# Patient Record
Sex: Male | Born: 1955
Health system: Southern US, Community
[De-identification: ages and names within clinical notes are randomized; demographics above are authoritative.]

## PROBLEM LIST (undated history)

## (undated) DIAGNOSIS — R972 Elevated prostate specific antigen [PSA]: Secondary | ICD-10-CM

## (undated) DIAGNOSIS — E119 Type 2 diabetes mellitus without complications: Secondary | ICD-10-CM

## (undated) DIAGNOSIS — I498 Other specified cardiac arrhythmias: Secondary | ICD-10-CM

## (undated) DIAGNOSIS — K219 Gastro-esophageal reflux disease without esophagitis: Secondary | ICD-10-CM

## (undated) DIAGNOSIS — F419 Anxiety disorder, unspecified: Secondary | ICD-10-CM

## (undated) DIAGNOSIS — F32A Depression, unspecified: Secondary | ICD-10-CM

## (undated) DIAGNOSIS — I4891 Unspecified atrial fibrillation: Secondary | ICD-10-CM

## (undated) DIAGNOSIS — F319 Bipolar disorder, unspecified: Secondary | ICD-10-CM

## (undated) DIAGNOSIS — I10 Essential (primary) hypertension: Secondary | ICD-10-CM

## (undated) DIAGNOSIS — E785 Hyperlipidemia, unspecified: Secondary | ICD-10-CM

## (undated) DIAGNOSIS — I219 Acute myocardial infarction, unspecified: Secondary | ICD-10-CM

## (undated) DIAGNOSIS — F329 Major depressive disorder, single episode, unspecified: Secondary | ICD-10-CM

## (undated) DIAGNOSIS — N400 Enlarged prostate without lower urinary tract symptoms: Secondary | ICD-10-CM

## (undated) DIAGNOSIS — F988 Other specified behavioral and emotional disorders with onset usually occurring in childhood and adolescence: Secondary | ICD-10-CM

## (undated) HISTORY — PX: COLONOSCOPY: SHX174

## (undated) HISTORY — DX: Hyperlipidemia, unspecified: E78.5

## (undated) HISTORY — DX: Other specified behavioral and emotional disorders with onset usually occurring in childhood and adolescence: F98.8

## (undated) HISTORY — PX: PROSTATE BIOPSY: SHX241

## (undated) HISTORY — DX: Benign prostatic hyperplasia without lower urinary tract symptoms: N40.0

## (undated) HISTORY — DX: Anxiety disorder, unspecified: F41.9

## (undated) HISTORY — DX: Type 2 diabetes mellitus without complications: E11.9

## (undated) HISTORY — PX: OTHER SURGICAL HISTORY: SHX169

## (undated) HISTORY — DX: Other specified cardiac arrhythmias: I49.8

## (undated) HISTORY — PX: FRACTURE SURGERY: SHX138

## (undated) HISTORY — DX: Bipolar disorder, unspecified: F31.9

## (undated) HISTORY — DX: Elevated prostate specific antigen (PSA): R97.20

## (undated) HISTORY — DX: Major depressive disorder, single episode, unspecified: F32.9

## (undated) HISTORY — DX: Depression, unspecified: F32.A

## (undated) HISTORY — DX: Acute myocardial infarction, unspecified: I21.9

## (undated) HISTORY — PX: LEG SURGERY: SHX1003

## (undated) HISTORY — DX: Gastro-esophageal reflux disease without esophagitis: K21.9

---
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
## 1997-10-31 HISTORY — PX: CARPAL TUNNEL RELEASE: SHX101

## 1999-01-20 ENCOUNTER — Encounter: Payer: Self-pay | Admitting: Vascular Surgery

## 1999-01-26 ENCOUNTER — Encounter: Payer: Self-pay | Admitting: Orthopedic Surgery

## 1999-01-26 ENCOUNTER — Inpatient Hospital Stay (HOSPITAL_COMMUNITY): Admission: RE | Admit: 1999-01-26 | Discharge: 1999-01-28 | Payer: Self-pay | Admitting: Orthopedic Surgery

## 2000-04-11 ENCOUNTER — Encounter: Payer: Self-pay | Admitting: Family Medicine

## 2000-04-11 ENCOUNTER — Encounter: Admission: RE | Admit: 2000-04-11 | Discharge: 2000-04-11 | Payer: Self-pay | Admitting: Family Medicine

## 2002-01-17 ENCOUNTER — Ambulatory Visit (HOSPITAL_BASED_OUTPATIENT_CLINIC_OR_DEPARTMENT_OTHER): Admission: RE | Admit: 2002-01-17 | Discharge: 2002-01-18 | Payer: Self-pay | Admitting: Orthopedic Surgery

## 2005-02-17 ENCOUNTER — Ambulatory Visit (HOSPITAL_COMMUNITY): Admission: RE | Admit: 2005-02-17 | Discharge: 2005-02-17 | Payer: Self-pay | Admitting: *Deleted

## 2005-10-31 HISTORY — PX: CERVICAL FUSION: SHX112

## 2007-03-30 ENCOUNTER — Observation Stay (HOSPITAL_COMMUNITY): Admission: RE | Admit: 2007-03-30 | Discharge: 2007-03-30 | Payer: Self-pay | Admitting: Neurosurgery

## 2007-06-09 ENCOUNTER — Emergency Department (HOSPITAL_COMMUNITY): Admission: EM | Admit: 2007-06-09 | Discharge: 2007-06-09 | Payer: Self-pay | Admitting: Emergency Medicine

## 2010-10-31 HISTORY — PX: HERNIA REPAIR: SHX51

## 2010-12-18 ENCOUNTER — Other Ambulatory Visit (HOSPITAL_COMMUNITY): Payer: Managed Care, Other (non HMO)

## 2010-12-18 ENCOUNTER — Inpatient Hospital Stay (HOSPITAL_COMMUNITY)
Admission: EM | Admit: 2010-12-18 | Discharge: 2010-12-19 | DRG: 312 | Disposition: A | Discharge status: Home / Self Care | Payer: Managed Care, Other (non HMO) | Attending: Cardiology | Admitting: Cardiology

## 2010-12-18 ENCOUNTER — Encounter (HOSPITAL_COMMUNITY): Payer: Self-pay | Admitting: Radiology

## 2010-12-18 ENCOUNTER — Emergency Department (HOSPITAL_COMMUNITY): Payer: Managed Care, Other (non HMO)

## 2010-12-18 DIAGNOSIS — E7800 Pure hypercholesterolemia, unspecified: Secondary | ICD-10-CM | POA: Diagnosis present

## 2010-12-18 DIAGNOSIS — Z7982 Long term (current) use of aspirin: Secondary | ICD-10-CM

## 2010-12-18 DIAGNOSIS — R55 Syncope and collapse: Principal | ICD-10-CM | POA: Diagnosis present

## 2010-12-18 DIAGNOSIS — Z833 Family history of diabetes mellitus: Secondary | ICD-10-CM

## 2010-12-18 DIAGNOSIS — E739 Lactose intolerance, unspecified: Secondary | ICD-10-CM | POA: Diagnosis present

## 2010-12-18 DIAGNOSIS — Z8249 Family history of ischemic heart disease and other diseases of the circulatory system: Secondary | ICD-10-CM

## 2010-12-18 DIAGNOSIS — I4891 Unspecified atrial fibrillation: Secondary | ICD-10-CM | POA: Diagnosis present

## 2010-12-18 DIAGNOSIS — I119 Hypertensive heart disease without heart failure: Secondary | ICD-10-CM | POA: Diagnosis present

## 2010-12-18 HISTORY — DX: Unspecified atrial fibrillation: I48.91

## 2010-12-18 HISTORY — DX: Essential (primary) hypertension: I10

## 2010-12-18 LAB — POCT I-STAT, CHEM 8
BUN: 15 mg/dL (ref 6–23)
Calcium, Ion: 1.17 mmol/L (ref 1.12–1.32)
Chloride: 101 mEq/L (ref 96–112)
Creatinine, Ser: 1.1 mg/dL (ref 0.4–1.5)
Glucose, Bld: 191 mg/dL — ABNORMAL HIGH (ref 70–99)
HCT: 43 % (ref 39.0–52.0)
Hemoglobin: 14.6 g/dL (ref 13.0–17.0)
Potassium: 3.9 mEq/L (ref 3.5–5.1)
Sodium: 138 mEq/L (ref 135–145)
TCO2: 27 mmol/L (ref 0–100)

## 2010-12-18 LAB — CARDIAC PANEL(CRET KIN+CKTOT+MB+TROPI)
CK, MB: 1.2 ng/mL (ref 0.3–4.0)
Total CK: 59 U/L (ref 7–232)
Troponin I: 0.01 ng/mL (ref 0.00–0.06)

## 2010-12-18 LAB — POCT CARDIAC MARKERS: Myoglobin, poc: 41.7 ng/mL (ref 12–200)

## 2010-12-18 LAB — DIFFERENTIAL
Basophils Absolute: 0 10*3/uL (ref 0.0–0.1)
Basophils Relative: 1 % (ref 0–1)
Monocytes Absolute: 0.5 10*3/uL (ref 0.1–1.0)
Neutro Abs: 4 10*3/uL (ref 1.7–7.7)
Neutrophils Relative %: 62 % (ref 43–77)

## 2010-12-18 LAB — CBC
Hemoglobin: 14.7 g/dL (ref 13.0–17.0)
MCHC: 36.5 g/dL — ABNORMAL HIGH (ref 30.0–36.0)
WBC: 6.5 10*3/uL (ref 4.0–10.5)

## 2010-12-18 LAB — GLUCOSE, CAPILLARY: Glucose-Capillary: 132 mg/dL — ABNORMAL HIGH (ref 70–99)

## 2010-12-18 LAB — HEMOGLOBIN A1C: Mean Plasma Glucose: 140 mg/dL — ABNORMAL HIGH (ref <117)

## 2010-12-19 LAB — BASIC METABOLIC PANEL
BUN: 9 mg/dL (ref 6–23)
CO2: 29 mEq/L (ref 19–32)
Chloride: 106 mEq/L (ref 96–112)
Creatinine, Ser: 1.11 mg/dL (ref 0.4–1.5)

## 2010-12-19 LAB — CBC
MCH: 29.7 pg (ref 26.0–34.0)
MCHC: 34.2 g/dL (ref 30.0–36.0)
MCV: 86.8 fL (ref 78.0–100.0)
Platelets: 190 10*3/uL (ref 150–400)
RDW: 13 % (ref 11.5–15.5)

## 2010-12-24 NOTE — H&P (Signed)
NAMEGENNARO, Sean Price              ACCOUNT NO.:  [id number]  MEDICAL RECORD NO.:  [id number]           PATIENT TYPE:  E  LOCATION:  [facility]                         FACILITY:  [facility]  PHYSICIAN:  Karn Cassis, MD  DATE OF BIRTH:  1955/11/12  DATE OF ADMISSION:  12/18/2010 DATE OF DISCHARGE:                             HISTORY & PHYSICAL   PRIMARY CARDIOLOGIST:  Armanda Magic, MD.  PRIMARY CARE PHYSICIAN:  Dr. Tanya Nones.  CHIEF COMPLAINT:  I passed out.  HISTORY OF PRESENT ILLNESS:  Sean Price is a 55 year old male with history of glucose intolerance and hypertension who is being admitted for syncope and new diagnosis of atrial fibrillation.  The patient was in his usual state of health up until 10 p.m. on December 17, 2010 when he lost consciousness shortly after getting up from a toilet after a bowel movement.  The patient reports that he has had loose stools for the past 1 day.  He felt nauseous and lightheadedright before passing out.  He recalls that as soon as he got up from the toilet, he passed out but denies hitting his head on the floor.  His wife who was immediately available estimates that the loss of consciousness was approximately 2-3 minutes in duration.  His wife did not notice any seizure activity.  The patient denies bowel or bladder incontinence.  When the EMS arrived, his blood glucose was approximately 150.  They performed orthostatic maneuvers.  His blood pressure supine was 108/60 with a heart rate of 107.  When he stood up his blood pressure dropped to 72/56 with a heart rate of 123.  At this point, he was brought to the emergency department.  After arrival at the [facility] Emergency Department, he was found to be in atrial fibrillation with heart rate of 105.  He was given a single dose of metoprolol IV.  REVIEW OF SYSTEMS:  A 12-point review of systems is negative except for symptoms outlined in the HPI above.  Specifically, the patient denies any  symptoms of chest tightness, palpitations, dyspnea, orthopnea.  He has 20-pound weight loss which he reports that intentional.  He has been trying to loss weight for the last 6 months.  He has had occasional loose stools.  The remainder of review of system was negative.  PAST MEDICAL HISTORY: 1. History of hypertension. 2. "Borderline diabetic."  PAST SURGICAL HISTORY:  He has had history of cervical decompression.  RELEVANT CARDIAC HISTORY:  The patient was initially evaluated for symptoms of indigestion in 2006.  He had a nuclear stress test, which was positive for questionable anterior ischemia.  This led to a cardiac catheterization, which was done on February 17, 2005.  The cath showed absolutely no angiographic evidence of coronary disease.  His EF was 65- 70% at that point.  MEDICATIONS:  He is on amlodipine 5 mg once a day which was recently added as well as lisinopril 40 mg once a day.  FAMILY HISTORY:  His mother had history of diabetes.  Father had history of coronary disease and died of an MI.  His siblings have history of diabetes.  SOCIAL  HISTORY:  He works at a [employer].  He denies any history of tobacco use, alcohol abuse or illicit drug use.  PHYSICAL EXAMINATION:  VITALS:  Temperature 98.0, pulse of 111, respiratory rate is 16, saturating 98% on room air, his blood pressure is 113/79. GENERAL:  The patient is a well-appearing male in no apparent distress. HEENT:  Sclera is clear.  Extraocular movement is intact. NECK:  No carotid bruit.  No JVD. CARDIOVASCULAR:  Irregular.  Normal S1,S2.  Pulses are 2+ bilaterally. No murmurs. LUNGS:  Clear to auscultation bilaterally. SKIN:  No rashes or lesions. ABDOMEN:  Soft, nontender, nondistended. EXTREMITIES:  No signs of clubbing, cyanosis or edema. NEUROLOGIC:  Alert and oriented x3.  Cranial nerves II-XII are grossly intact.  EKG shows atrial fibrillation with old inferior Q-waves, which were present  when compared to an EKG from March 2010.  LABORATORY DATA:  His chemistries are unremarkable with except for glucose of 191.  His first set of cardiac markers are negative.  ASSESSMENT/PLAN:  In summary, this is a 55 year old male with history of hypertension, "borderline diabetes" who is being admitted for syncope and new diagnosis of atrial fibrillation. 1. Syncope:  The most likely etiology is vasovagal given the fact that     the patient was having bowel movement and felt nauseous right     before passing out.  This was probably compounded by hypovolemia as     determined by positive orthostatic maneuvers.  His systolic dropped     from 108 down to 72.  Our plan at this point is to hydrate him     gently and recheck orthostatic blood pressures.  In the meanwhile,     I will perform a cardiac echo, order carotid Dopplers, place him on     telemetry, and order MRI of the head to look for other     cardiovascular causes of syncope. 2. New atrial fibrillation:  The patient's CHADS score is 1.  However,     if his questionable history of diabetes is counted that his CHADS     score becomes 2.  For now, I will start him on a full dose aspirin,     initiate beta blocker for better rate control, check TSH, and order     transthoracic echo to look for valvular disease.  We will also     further evaluate his questionable history of diabetes by obtaining     hemoglobin A1c and fasting blood glucose.  If the patient is found     to be truly diabetic, he may benefit for anticoagulation with     either  Pradaxa or     warfarin. 3. History of hypertension:  The patient's blood pressures have been     on the low side with a systolic between 72 to low one teens.  After     adequate IV resuscitation, we will restart him on his home dose of     lisinopril.     Karn Cassis, MD     PV/MEDQ  D:  12/18/2010  T:  12/18/2010  Job:  [id number]  Electronically Signed by Karn Cassis MD on  12/24/2010 08:59:53 PM

## 2011-01-17 NOTE — Discharge Summary (Signed)
[name], [name]              ACCOUNT NO.:  [id number]  MEDICAL RECORD NO.:  [id number]           PATIENT TYPE:  I  LOCATION:  3712                         FACILITY:  [facility]  PHYSICIAN:  Sean Price, M.D.DATE OF BIRTH:  03/01/1956  DATE OF ADMISSION:  12/18/2010 DATE OF DISCHARGE:  12/19/2010                              DISCHARGE SUMMARY   FINAL DIAGNOSES: 1. Syncope. 2. New onset of atrial fibrillation, resolved. 3. Hypertensive heart disease. 4. Glucose intolerance. 5. History of cervical decompression.  PROCEDURE:  Echocardiogram.  HISTORY OF PRESENT ILLNESS:  A 55 year old male, who has a previous history of hypertensive heart disease and glucose intolerance.  He has a previous history of being evaluated for chest pain and had a negative catheterization in April 2006 done by Dr. Fraser Price.  He also has hypertensive heart disease and recently had amlodipine added to his regimen, but reportedly normotensive at home.  He was in his usual state of health until the night of admission that he developed the onset of syncope after having had a bowel movement with some diarrhea.  He was having loose stools for the past day or so.  He felt nausea, some lightheaded, and then was unconscious.  His wife called EMS.  He was found to be in atrial fibrillation when he came in and was found to be somewhat orthostatic.  He was given a single dose of metoprolol IV and was admitted to the hospital for further evaluation.  Please see the previously dictated history and physical for remainder of the details.  HOSPITAL COURSE:  Lab data on admission showed a hemoglobin of 14.6, hematocrit of 43.  His hemoglobin A1c is 6.5.  His troponin is negative. Sodium is 141, potassium 4.5, chloride 106, CO2 of 29, glucose 131, BUN is 9, and creatinine is 1.11.  He had an MRI of his brain done this admission that was negative for acute infarct.  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  There was no chest x- ray done on admission.  His blood pressure was normotensive when he was in the hospital.  His amlodipine was withheld and his ACE inhibitor was withheld.  He was given flecainide and converted to sinus rhythm and the flecainide was stopped.  His anticoagulation was stopped.  He was feeling well and was discharged home on metoprolol 25 mg b.i.d., aspirin 81 mg daily, and lisinopril 40 mg daily.  He will stop his amlodipine at home.  He has a hemoglobin A1c of 6.5, so there is a question about whether he has diabetes or not.  For the time being, we will anticoagulate him with aspirin, but with hypertension and possible diabetes, he could have a CHADS score of 2 and may warrant anticoagulation with Pradaxa or warfarin, but we will leave this decision to Dr. Mayford Price.  He is discharged at this time in improved condition and is to call Dr. Mayford Price for an appointment this week.  He will continue to follow up with his primary doctor.  He will need to have the cyst noted evaluated.     Sean Price  Sean Price, M.D.     WST/MEDQ  D:  12/19/2010  T:  12/19/2010  Job:  [id number]  cc:   Sean Price, M.D.  Electronically Signed by Lacretia Nicks. Sean Price M.D. on 01/17/2011 04:56:39 PM

## 2011-03-15 NOTE — Op Note (Signed)
Sean Price, Sean Price              ACCOUNT NO.:  [id number]   MEDICAL RECORD NO.:  [id number]          PATIENT TYPE:  AMB   LOCATION:  SDS                          FACILITY:  [facility]   PHYSICIAN:  Coletta Memos, M.D.     DATE OF BIRTH:  1956-10-19   DATE OF PROCEDURE:  03/29/2007  DATE OF DISCHARGE:                               OPERATIVE REPORT   PREOPERATIVE DIAGNOSIS:  1. Cervical spondylosis with myelopathy C5-6, C6-7.  2. Cervical radiculopathy.  3. Cervical displaced disk, right C6-7.   INDICATIONS:  Navon Kotowski presented to the office with severe pain in  his right upper extremity.  MRI showed what was a fairly large disk  herniation at C6-7 and foraminal narrowing present at C5-6.  I therefore  recommended and he agreed to undergo operative decompression at C5-6 and  C6-7 via an anterior approach.   PROCEDURE:  1. Anterior cervical decompression C5-6, C6-7.  2. Arthrodesis C5 to C7 with two 7-mm allografts.  3. Anterior instrumentation C5-C7 using a Vector 32 mm plate.   COMPLICATIONS:  None.   SURGEON:  Coletta Memos, M.D.   ASSISTANT:  Clydene Fake, M.D.   OPERATIVE NOTE:  Mr. Roye was brought to the operating room  intubated and placed under general anesthetic without difficulty.  He  was positioned with his head in neutral position on a horseshoe  headrest.  His neck was prepped and he was draped in a sterile fashion.  I infiltrated 3 mL 0.5% lidocaine 1:200,000 strength epinephrine.  I  made an incision with a #10 blade.  I took this down to the platysma.  I  then dissected rostrally and caudally in a plane superior to the  platysma.  I opened the platysma in a horizontal fashion using  Metzenbaum scissors.  I then dissected rostrally and caudally in a plane  inferior to the platysma.  Using sharp dissection and blunt dissection I  was able to fashion an avascular corridor between the medial strap  muscles and the omohyoid and sternocleidomastoid and  carotid artery  laterally.  I then used a peanut to remove soft tissue from the cervical  spine.  I placed a spinal needle and that on x-ray was shown to be at C5-  6.  Using that as my guide I then reflected the longus colli muscles  bilaterally from C5-C7.  I placed self-retaining retractors and  proceeded with the decompression.   I performed decompression C5-6 first by opening the disk space with a  #15 blade.  I then used curettes, pituitary rongeurs, a high-speed drill  and Kerrison punches to remove disk end plate and osteophytes.  The  osteophytes at C5-6 level were impressive and large.  I used a drill  extensively to remove them.  I was able with Dr. Doreen Beam assistance to  effect a full decompression of the C6 nerve root on the right side and  on the left side.  We fully decompress the spinal canal at that level.  After achieving hemostasis I then prepared for the arthrodesis.   The arthrodesis was performed at C5-6 first  by using a high-speed drill  to remove all soft tissue from the ends of the intervertebral bodies.  I  also used a barrel-shaped bur to create a square space for the plug.  I  sized the allograft and felt that it would be the most appropriate to  use a 7 mm graft.  I placed a 7 mm graft without difficulty into the  disk space.  I now prepared for the C6-7 level.   Decompression at C6-7 level was performed by first using a 15 blade to  open the disk space at C6-7.  Dural large osteophytes overlying the disk  space.  I also used a pituitary rongeur and Kerrison punch to even out  the vertebral bodies there secondary to the overhang of the osteophyte.  I used a high-speed drill and removed a great deal of bone and  osteophytes.  The posterior longitudinal ligament at the C6-7 level was  very thick very redundant and obviously compressing the spinal canal.  With Dr. Doreen Beam assistance we removed that with Kerrison punches in a  slow meticulous fashion using  microdissection.  After decompressing the  thecal sac, we then turned our attention to fully decompressing the  right C7 root.  There was a large number of disk fragments overlying the  nerve root.  Using the Kerrison punch and a small hook, we were able to  remove those fragments and completely free the nerve root.  Its egress  was widely patent when I was done.  On the left side as he had no pain  there, we were not nearly as aggressive but the nerve root was well  decompressed.  I then irrigated the wound.  I then prepared for  arthrodesis.   The endplates were drilled to remove soft tissue and to even their  surfaces to accept a graft.  Again I sized and felt it best to use a 7  mm graft.  This was a Synthes ACS graft.  The graft was placed without  difficulty.  Distraction pins were used at both C5-6 and at C6-7 levels  to aid in the decompression and placement of the allograft.   I then placed the anterior instrumentation using a 32 mm plate.  The two  screws placed a C5, two at C6, two at C7 first by drilling and then  using self-tapping screws.  I also used a Leksell rongeur to even out  the anterior portion of the vertebral bodies of the osteophytes.  They  were quite uneven and it would have made it difficult to place a plate  with good purchase.   X-ray showed that the plate, plug and screws were in good position.  I  then irrigated the wound.  With Dr. Doreen Beam assistance we closed the  wound in layered fashion using Vicryl sutures to reapproximate the  platysma and subcuticular layer.  Dermabond was used for sterile  dressing.           ______________________________  Coletta Memos, M.D.     KC/MEDQ  D:  03/30/2007  T:  03/30/2007  Job:  [id number]

## 2011-03-18 NOTE — Op Note (Signed)
[place]  Patient:    ZEKI, BEDROSIAN Visit Number: [id number] MRN: [id number]          Service Type: DSU Location: [facility] Attending Physician:  Colbert Ewing Dictated by:   Loreta Ave, M.D. Proc. Date: 01/17/02 Admit Date:  01/17/2002 Discharge Date: 01/18/2002                             Operative Report  PREOPERATIVE DIAGNOSES: 1. Status post markedly comminuted open tibia and fibula fracture, right. 2. Heeled fracture with painful retained hardware. 3. Degenerative joint disease and bony impingement with loose bodies, right    ankle.  POSTOPERATIVE DIAGNOSES: 1. Status post markedly comminuted open tibia and fibula fracture, right. 2. Heeled fracture with painful retained hardware. 3. Degenerative joint disease and bony impingement with loose bodies, right    ankle.  PROCEDURES: 1. Removal of plate and screws from right tibia. 2. Removal of plate and screws from right fibula. 3. Arthroscopy, right ankle, with extensive debridement of adhesions, chondral    loose bodies, chondromalacia and removal of tibiotalar anterior spurs    causing impingement.  SURGEON:  Loreta Ave, M.D.  ASSISTANT:  Arlys John D. Petrarca, P.A.-C.  ANESTHESIA:  General.  BLOOD LOSS:  Minimal.  TOURNIQUET TIME:  One hour.  SPECIMENS:  None.  CULTURES:  None.  COMPLICATIONS:  None.  DRESSINGS:  Soft compressive with short-leg splint.  DESCRIPTION OF PROCEDURE:  The patient was brought to the operating room and after adequate anesthesia had been obtained, right ankle was examined.  No dorsiflexion and plantar flexion to about 20 degrees with crepitus with stable ankle.  Tourniquet applied as well as the stirrup for ankle arthroscopy.  The patient was prepped and draped in the usual sterile fashion.  Exsanguinated with elevation and Esmarch.  Tourniquet inflated to 350 mmHg.  Incisions over the tibia and fibula were opened exposing the  implanted hardware.  All the plate and screws were removed without difficulty.  The prominence of bone around the margins of the plate and through the screw holes were all contoured to a nice, smooth, stable surface and the wounds thoroughly irrigated.  They were closed with Vicryl and then staples with injection of Marcaine without epinephrine.  Anteromedial and anterolateral port holes for the ankle were created for arthroscopy.  Ankle entered with blunt obturator distended and inspected.  Extensive adhesions throughout all debrided.  Grade 3 with some focal grade 4 changes throughout the entire ankle.  All chondral loose bodies, uneven articular cartilages, adhesions and synovitis debrided to a stable surface.  I then did an extensive removal of the anterior impinging spurs on the tibia and talus so that on completion, I had no anterior impingement. Motion was improved to about five degrees of dorsiflexion and 40 degrees of plantar flexion maintaining a stable ankle.  The entire ankle examined arthroscopically and all loose bodies confirmed to be removed.  The most extensive changes were on the distal tibia posteriorly which were grade 3 and 4 as well as patches on the medial and lateral aspects of the talus which were grade 3 and 4.  Some remaining articular cartilage throughout, however. Instruments were removed.  Port holes and ankle injected Marcaine.  Port holes were closed with nylon.  Sterile compressive dressing and short-leg splint applied.  Tourniquet inflated and removed.  Anesthesia was reversed and brought to the recovery room.  The patient tolerated surgery  well with no complications. Dictated by:   Loreta Ave, M.D. Attending Physician:  Colbert Ewing DD:  01/17/02 TD:  01/20/02 Job: [id number]

## 2011-03-18 NOTE — Cardiovascular Report (Signed)
NAMEPHILLIP, Sean Price NO.:  [id number]   MEDICAL RECORD NO.:  [id number]          PATIENT TYPE:  OIB   LOCATION:  2899                         FACILITY:  [facility]   PHYSICIAN:  Meade Maw, M.D.    DATE OF BIRTH:  1955/12/21   DATE OF PROCEDURE:  02/17/2005  DATE OF DISCHARGE:                              CARDIAC CATHETERIZATION   INDICATIONS FOR PROCEDURE:  Ongoing chest pain. Stress Cardiolite revealing  a decrease uptake in the distal anterior wall. Minimal reversal procedure.   DESCRIPTION OF PROCEDURE:  After obtaining written informed consent, the  patient was brought to the cardiac catheterization lab in a post absorptive  state. Preoperative sedation was achieved using Versed 2 mg IV. The right  groin was prepped and draped in the usual sterile fashion. Local anesthesia  was achieved using 1% Xylocaine. A 6-French hemostasis sheath was placed  into the right femoral artery using a modified Seldinger technique.  Selective coronary angiography was performed using JL-4, JR-4 Judkins  catheter. Multiple views were obtained. All catheter exchanges were made  over a guide wire.   FINDINGS:  The aortic pressure is 135/92, LV pressure was 129/70. EDP is 12.   SINGLE PLANE VENTRICULOGRAM:  Revealed normal wall motion, ejection fraction  65-70%. There was no mitral regurgitation noted.   CORONARY ANGIOGRAPHY:  1.  Left main coronary artery bifurcates into the left anterior descending      and circumflex vessel. There is no disease noted in the left main      coronary artery.  2.  Left anterior descending:  Left anterior descending gives rise to a      small D1, moderate D2, small D3, goes on to an apical recurrent branch.      There is no disease noted in the left anterior descending branches.  3.  Circumflex vessel:  Circumflex vessel gives rise to a trivial OM1, a      large bifurcating OM2, large OM3. There was no disease noted in the      circumflex or its  branches.  4.  The right coronary artery:  Right coronary artery is dominant for the      posterior circulation. Gives rise to trivial RV marginals, PDA and PLV      branch. There is no disease noted in the right coronary artery.   FINAL IMPRESSION:  Normal coronary angiography and normal single-plane  ventriculogram.   RECOMMENDATION:  Consider other etiologies for chest pain. Consider GI  evaluation.      HP/MEDQ  D:  02/17/2005  T:  02/18/2005  Job:  [id number]

## 2011-05-17 ENCOUNTER — Encounter: Payer: Self-pay | Admitting: Gastroenterology

## 2011-05-30 ENCOUNTER — Ambulatory Visit (AMBULATORY_SURGERY_CENTER): Payer: Managed Care, Other (non HMO) | Admitting: *Deleted

## 2011-05-30 VITALS — Ht 70.0 in | Wt 225.5 lb

## 2011-05-30 DIAGNOSIS — Z1211 Encounter for screening for malignant neoplasm of colon: Secondary | ICD-10-CM

## 2011-05-30 MED ORDER — PEG-KCL-NACL-NASULF-NA ASC-C 100 G PO SOLR: ORAL | Status: DC

## 2011-06-01 ENCOUNTER — Encounter: Payer: Self-pay | Admitting: Gastroenterology

## 2011-06-13 ENCOUNTER — Encounter: Payer: Self-pay | Admitting: Gastroenterology

## 2011-06-13 ENCOUNTER — Ambulatory Visit (AMBULATORY_SURGERY_CENTER): Payer: Managed Care, Other (non HMO) | Admitting: Gastroenterology

## 2011-06-13 DIAGNOSIS — D126 Benign neoplasm of colon, unspecified: Secondary | ICD-10-CM

## 2011-06-13 DIAGNOSIS — Z8371 Family history of colonic polyps: Secondary | ICD-10-CM

## 2011-06-13 DIAGNOSIS — Z1211 Encounter for screening for malignant neoplasm of colon: Secondary | ICD-10-CM

## 2011-06-13 DIAGNOSIS — Z800 Family history of malignant neoplasm of digestive organs: Secondary | ICD-10-CM

## 2011-06-13 MED ORDER — SODIUM CHLORIDE 0.9 % IV SOLN: 500.0000 mL | INTRAVENOUS | Status: DC

## 2011-06-13 NOTE — Progress Notes (Signed)
Pt tolerated the exam very well.MAW 

## 2011-06-13 NOTE — Patient Instructions (Signed)
Please read the handouts given to you by your recovery room nurse.  Your biopsy results will be mailed to you within 2 weeks.   You may resume your routine medications today.  If you have any questions, please call us at [phone]. Thank-you. 

## 2011-06-14 ENCOUNTER — Telehealth: Payer: Self-pay

## 2011-06-14 NOTE — Telephone Encounter (Signed)

## 2011-06-21 ENCOUNTER — Encounter: Payer: Self-pay | Admitting: Gastroenterology

## 2013-03-01 ENCOUNTER — Other Ambulatory Visit: Payer: Self-pay | Admitting: Physician Assistant

## 2013-03-01 NOTE — Telephone Encounter (Signed)
Med refill made.  Patient is due for office visit.  Pt  Called and left mess to call and schedule appt.

## 2013-03-07 ENCOUNTER — Ambulatory Visit (INDEPENDENT_AMBULATORY_CARE_PROVIDER_SITE_OTHER): Payer: BC Managed Care – PPO | Admitting: Family Medicine

## 2013-03-07 ENCOUNTER — Encounter: Payer: Self-pay | Admitting: Family Medicine

## 2013-03-07 VITALS — BP 110/86 | HR 86 | Temp 98.1°F | Resp 18 | Wt 203.0 lb

## 2013-03-07 DIAGNOSIS — I152 Hypertension secondary to endocrine disorders: Secondary | ICD-10-CM | POA: Insufficient documentation

## 2013-03-07 DIAGNOSIS — E1159 Type 2 diabetes mellitus with other circulatory complications: Secondary | ICD-10-CM | POA: Insufficient documentation

## 2013-03-07 DIAGNOSIS — I10 Essential (primary) hypertension: Secondary | ICD-10-CM

## 2013-03-07 DIAGNOSIS — E1169 Type 2 diabetes mellitus with other specified complication: Secondary | ICD-10-CM | POA: Insufficient documentation

## 2013-03-07 DIAGNOSIS — E119 Type 2 diabetes mellitus without complications: Secondary | ICD-10-CM

## 2013-03-07 DIAGNOSIS — E785 Hyperlipidemia, unspecified: Secondary | ICD-10-CM

## 2013-03-07 LAB — HEPATIC FUNCTION PANEL
ALT: 14 U/L (ref 0–53)
Bilirubin, Direct: 0.1 mg/dL (ref 0.0–0.3)
Indirect Bilirubin: 0.4 mg/dL (ref 0.0–0.9)
Total Bilirubin: 0.5 mg/dL (ref 0.3–1.2)

## 2013-03-07 LAB — LIPID PANEL
Cholesterol: 174 mg/dL (ref 0–200)
LDL Cholesterol: 109 mg/dL — ABNORMAL HIGH (ref 0–99)
Total CHOL/HDL Ratio: 4 Ratio
Triglycerides: 106 mg/dL (ref <150)
VLDL: 21 mg/dL (ref 0–40)

## 2013-03-07 LAB — BASIC METABOLIC PANEL
Calcium: 9.3 mg/dL (ref 8.4–10.5)
Creat: 0.98 mg/dL (ref 0.50–1.35)

## 2013-03-07 NOTE — Progress Notes (Signed)
Subjective:    Patient ID: Sean Price, male    DOB: [birth year], 57 y.o.   MRN: [id number]  HPI  Patient's there for followup of his hypertension, hyperlipidemia, and diabetes mellitus. He is fairly taking Norvasc 2.5 mg by mouth daily, Symmetrel 20 mg by mouth daily for his hypertension. His blood pressure is 110/86. He denies any chest pain, shortness of breath, or dyspnea on exertion. He also has hyperlipidemia. He is currently taking Lipitor 40 mg by mouth daily. He denies any right upper quadrant pain or myalgias. He is due for fasting lipid panel. With regards to diabetes, he is currently taking metformin 1000 mg by mouth twice a day. He denies any polyuria, polydipsia, or blurred vision. He denies any neuropathy in his feet. He has not had an eye exam in 3 years. He denies any hypoglycemia. He rarely checks his sugars when he does they're usually less than 120. He's had 2 episodes of vasovagal syncope, which was evaluated by his cardiologist with a one-month cardiac monitor. Past Medical History  Diagnosis Date  . Atrial fibrillation with RVR     new found on 12/18/10 visit  . Diabetes mellitus without complication   . Hypertension   . Hyperlipidemia    Current Outpatient Prescriptions on File Prior to Visit  Medication Sig Dispense Refill  . atorvastatin (LIPITOR) 40 MG tablet TAKE ONE TABLET BY MOUTH EVERY DAY  30 tablet  1  . Cholecalciferol (VITAMIN D-3) 5000 UNITS TABS Take by mouth daily.        [name] lisinopril (PRINIVIL,ZESTRIL) 20 MG tablet Take 20 mg by mouth daily.         No current facility-administered medications on file prior to visit.   Allergies  Allergen Reactions  . Naproxen Sodium     hearburn   History   Social History  . Marital Status: Married    Spouse Name: N/A    Number of Children: N/A  . Years of Education: N/A   Occupational History  . Not on file.   Social History Main Topics  . Smoking status: Former Smoker    Quit date: 05/29/1984   . Smokeless tobacco: Not on file  . Alcohol Use: 0.5 oz/week    1 drink(s) per week  . Drug Use: No  . Sexually Active: Not on file   Other Topics Concern  . Not on file   Social History Narrative  . No narrative on file     Review of Systems  All other systems reviewed and are negative.       Objective:   Physical Exam  Constitutional: He is oriented to person, place, and time. He appears well-developed and well-nourished.  Eyes: Conjunctivae are normal. Pupils are equal, round, and reactive to light.  Neck: No JVD present. No thyromegaly present.  Cardiovascular: Normal rate, regular rhythm, normal heart sounds and intact distal pulses.   No murmur heard. Pulmonary/Chest: Effort normal and breath sounds normal. No respiratory distress. He has no wheezes. He has no rales.  Abdominal: Soft. Bowel sounds are normal. He exhibits no distension. There is no tenderness. There is no rebound.  Lymphadenopathy:    He has no cervical adenopathy.  Neurological: He is alert and oriented to person, place, and time. He has normal reflexes. He displays normal reflexes. He exhibits normal muscle tone. Coordination normal.  Skin: Skin is warm. No rash noted.          Assessment & Plan:  1. Type II  or unspecified type diabetes mellitus without mention of complication, not stated as uncontrolled Check hemoglobin A1c. His goal A1c is less than 6.5. I will also consult ophthalmology as he is due for her retinal examination. Given his history of paroxysmal atrial fibrillation and his diabetes, I recommended he resume an aspirin. He states his cardiologist to stop this. Be a wise idea to at least take an 81 mg baby aspirin per day. - Basic Metabolic Panel - Hepatic Function Panel - Lipid Panel - Hemoglobin A1c - Ambulatory referral to Ophthamology.  2. Hypertension Blood pressure is well controlled. Continue current medications.  4. Hyperlipidemia Fasting lipid panel, goal LDL is less  than 100.

## 2013-05-09 ENCOUNTER — Telehealth: Payer: Self-pay | Admitting: Family Medicine

## 2013-05-09 ENCOUNTER — Encounter: Payer: Self-pay | Admitting: Family Medicine

## 2013-05-09 ENCOUNTER — Ambulatory Visit (INDEPENDENT_AMBULATORY_CARE_PROVIDER_SITE_OTHER): Payer: BC Managed Care – PPO | Admitting: Family Medicine

## 2013-05-09 VITALS — BP 140/98 | HR 78 | Temp 98.3°F | Resp 16 | Wt 220.0 lb

## 2013-05-09 DIAGNOSIS — F32A Depression, unspecified: Secondary | ICD-10-CM

## 2013-05-09 DIAGNOSIS — F329 Major depressive disorder, single episode, unspecified: Secondary | ICD-10-CM

## 2013-05-09 MED ORDER — ALPRAZOLAM 0.5 MG PO TABS: 0.5000 mg | ORAL_TABLET | Freq: Every evening | ORAL | Status: DC | PRN

## 2013-05-09 MED ORDER — GLUCOSE BLOOD VI STRP: ORAL_STRIP | Status: DC

## 2013-05-09 MED ORDER — ESCITALOPRAM OXALATE 10 MG PO TABS: 10.0000 mg | ORAL_TABLET | Freq: Every day | ORAL | Status: DC

## 2013-05-09 MED ORDER — ONETOUCH ULTRA SYSTEM W/DEVICE KIT: 1.0000 | PACK | Freq: Once | Status: DC

## 2013-05-09 NOTE — Telephone Encounter (Signed)
Rx Refilled  

## 2013-05-09 NOTE — Progress Notes (Signed)
Subjective:    Patient ID: Sean Price, male    DOB: May 28, 1956, 57 y.o.   MRN: [id number]  HPI Patient brings in a copy of his most recent lab work. This reveals an HDL of 49, and LDL of 72, triglycerides of 97, and a hemoglobin A1c of 6.6.  All these results are excellent.  However his main concern today is depression.  He states over the last month he has had increasing depression and anhedonia. He cannot sleep at night. He cannot concentrate at work. He has no appetite. He is losing weight. He has no energy. He is even had thoughts of suicide. He is adamant that he would not act on these thoughts. He loves his wife and his family. He states that he is thoughts sometimes that her life would be better if he were not here. He has no plan to act on these thoughts.  I asked the patient if he thought he needed a short-term hospitalization to control the depression and he was adamant that he felt safe at home he just wanted me to know how severe the depression is becoming.   Past Medical History  Diagnosis Date  . Atrial fibrillation with RVR     new found on 12/18/10 visit  . Diabetes mellitus without complication   . Hypertension   . Hyperlipidemia    Current Outpatient Prescriptions on File Prior to Visit  Medication Sig Dispense Refill  . amLODipine (NORVASC) 2.5 MG tablet Take 2.5 mg by mouth daily.      [name] atorvastatin (LIPITOR) 40 MG tablet TAKE ONE TABLET BY MOUTH EVERY DAY  30 tablet  1  . Cholecalciferol (VITAMIN D-3) 5000 UNITS TABS Take by mouth daily.        [name] lisinopril (PRINIVIL,ZESTRIL) 20 MG tablet Take 20 mg by mouth daily.        . metFORMIN (GLUCOPHAGE) 1000 MG tablet Take 1,000 mg by mouth 2 (two) times daily with a meal.       No current facility-administered medications on file prior to visit.   Allergies  Allergen Reactions  . Naproxen Sodium     hearburn   History   Social History  . Marital Status: Married    Spouse Name: N/A    Number of Children: N/A   . Years of Education: N/A   Occupational History  . Not on file.   Social History Main Topics  . Smoking status: Former Smoker    Quit date: 05/29/1984  . Smokeless tobacco: Not on file  . Alcohol Use: 0.5 oz/week    1 drink(s) per week  . Drug Use: No  . Sexually Active: Not on file   Other Topics Concern  . Not on file   Social History Narrative  . No narrative on file   Family History  Problem Relation Age of Onset  . Colon cancer Brother     at age 17      Review of Systems  All other systems reviewed and are negative.       Objective:   Physical Exam  Vitals reviewed. Cardiovascular: Normal rate and regular rhythm.   Pulmonary/Chest: Effort normal and breath sounds normal.  Psychiatric: He has a normal mood and affect. His behavior is normal. Judgment and thought content normal.          Assessment & Plan:  1. Depression I contracted the patient for safety.  He will call me or go immediately to the hospital if he  has any thoughts of wanting to kill himself.  Meanwhile we will start Lexapro 10 mg by mouth daily. I also gave him Xanax 0.5 mg tablets to take at night as needed for sleep. I will see the patient back in 4 weeks or immediately if symptoms worsen. - escitalopram (LEXAPRO) 10 MG tablet; Take 1 tablet (10 mg total) by mouth daily.  Dispense: 30 tablet; Refill: 3 - ALPRAZolam (XANAX) 0.5 MG tablet; Take 1 tablet (0.5 mg total) by mouth at bedtime as needed for sleep.  Dispense: 30 tablet; Refill: 0

## 2013-06-04 ENCOUNTER — Other Ambulatory Visit: Payer: Self-pay | Admitting: Family Medicine

## 2013-06-04 NOTE — Telephone Encounter (Signed)
rx called in

## 2013-06-04 NOTE — Telephone Encounter (Signed)
Ok to refill 

## 2013-06-04 NOTE — Telephone Encounter (Signed)
Last OV 05/09/13  Last refill 05/09/13  #30 Need approval for controlled medication.

## 2013-06-06 ENCOUNTER — Ambulatory Visit (INDEPENDENT_AMBULATORY_CARE_PROVIDER_SITE_OTHER): Payer: BC Managed Care – PPO | Admitting: Family Medicine

## 2013-06-06 ENCOUNTER — Encounter: Payer: Self-pay | Admitting: Family Medicine

## 2013-06-06 VITALS — BP 142/90 | HR 72 | Temp 97.6°F | Resp 18 | Ht 70.0 in | Wt 205.0 lb

## 2013-06-06 DIAGNOSIS — F32A Depression, unspecified: Secondary | ICD-10-CM | POA: Insufficient documentation

## 2013-06-06 DIAGNOSIS — F329 Major depressive disorder, single episode, unspecified: Secondary | ICD-10-CM

## 2013-06-06 NOTE — Progress Notes (Signed)
Subjective:    Patient ID: Sean Price, male    DOB: 1956-04-30, 57 y.o.   MRN: [id number]  HPI  Please see the patient's last office visit.  He was started on Lexapro 10 mg by mouth daily for depression. The patient states he is doing much better. He is possibly 75% better. He is sleeping better. He is having minimal suicidal thoughts or suicidal ideation. He is calmer at work. He denies any anxiety or panic attacks. He is gaining 5 pounds due to increased appetite. He reports increasing energy. He has not needed to use Xanax in over a week. He denies any sexual side effects. He denies any gastrointestinal side effects on medication. Past Medical History  Diagnosis Date  . Atrial fibrillation with RVR     new found on 12/18/10 visit  . Diabetes mellitus without complication   . Hypertension   . Hyperlipidemia   . Depression    Current Outpatient Prescriptions on File Prior to Visit  Medication Sig Dispense Refill  . ALPRAZolam (XANAX) 0.5 MG tablet TAKE ONE TABLET BY MOUTH AT BEDTIME AS NEEDED FOR SLEEP  30 tablet  0  . amLODipine (NORVASC) 2.5 MG tablet Take 2.5 mg by mouth daily.      [name] atorvastatin (LIPITOR) 40 MG tablet TAKE ONE TABLET BY MOUTH EVERY DAY  30 tablet  1  . Blood Glucose Monitoring Suppl (ONE TOUCH ULTRA SYSTEM KIT) W/DEVICE KIT 1 kit by Does not apply route once.  1 each  11  . Cholecalciferol (VITAMIN D-3) 5000 UNITS TABS Take by mouth daily.        [name] escitalopram (LEXAPRO) 10 MG tablet Take 1 tablet (10 mg total) by mouth daily.  30 tablet  3  . fish oil-omega-3 fatty acids 1000 MG capsule Take 500 mg by mouth daily.      [name] glucose blood test strip One touch Ultra strips #100/11 refills, lancets #1 box, Checks BS bid and DX:250.00  100 each  11  . lisinopril (PRINIVIL,ZESTRIL) 20 MG tablet Take 20 mg by mouth daily.        . metFORMIN (GLUCOPHAGE) 1000 MG tablet Take 1,000 mg by mouth 2 (two) times daily with a meal.       No current facility-administered  medications on file prior to visit.   Allergies  Allergen Reactions  . Naproxen Sodium     hearburn   History   Social History  . Marital Status: Married    Spouse Name: N/A    Number of Children: N/A  . Years of Education: N/A   Occupational History  . Not on file.   Social History Main Topics  . Smoking status: Former Smoker    Quit date: 05/29/1984  . Smokeless tobacco: Not on file  . Alcohol Use: 0.5 oz/week    1 drink(s) per week  . Drug Use: No  . Sexually Active: Not on file   Other Topics Concern  . Not on file   Social History Narrative  . No narrative on file     Review of Systems  All other systems reviewed and are negative.       Objective:   Physical Exam  Vitals reviewed. Cardiovascular: Normal rate and regular rhythm.   Pulmonary/Chest: Effort normal and breath sounds normal.  Psychiatric: He has a normal mood and affect. His behavior is normal. Judgment and thought content normal.          Assessment & Plan:  1. Depression  Patient has improved dramatically. Continue Lexapro 10 mg by mouth daily. We will continue the medications for 3 months. At the end of 3 months we will try to wean him away from the Lexapro he wants to do that. I will be glad to refill the Xanax if needed I have recommended he try to use it as sparingly as possible.  Recheck in 3 months or sooner if worse.

## 2013-07-22 ENCOUNTER — Encounter: Payer: Self-pay | Admitting: Family Medicine

## 2013-07-22 DIAGNOSIS — N400 Enlarged prostate without lower urinary tract symptoms: Secondary | ICD-10-CM | POA: Insufficient documentation

## 2013-07-22 DIAGNOSIS — Z8679 Personal history of other diseases of the circulatory system: Secondary | ICD-10-CM | POA: Insufficient documentation

## 2013-07-22 DIAGNOSIS — I219 Acute myocardial infarction, unspecified: Secondary | ICD-10-CM | POA: Insufficient documentation

## 2013-07-22 DIAGNOSIS — I498 Other specified cardiac arrhythmias: Secondary | ICD-10-CM | POA: Insufficient documentation

## 2013-07-22 DIAGNOSIS — F419 Anxiety disorder, unspecified: Secondary | ICD-10-CM | POA: Insufficient documentation

## 2013-07-22 DIAGNOSIS — K219 Gastro-esophageal reflux disease without esophagitis: Secondary | ICD-10-CM | POA: Insufficient documentation

## 2013-08-06 ENCOUNTER — Other Ambulatory Visit: Payer: Self-pay | Admitting: Physician Assistant

## 2013-08-07 NOTE — Telephone Encounter (Signed)
Medication refilled per protocol. 

## 2013-09-06 ENCOUNTER — Other Ambulatory Visit: Payer: Self-pay | Admitting: Family Medicine

## 2013-09-06 ENCOUNTER — Encounter: Payer: Self-pay | Admitting: Family Medicine

## 2013-09-06 NOTE — Telephone Encounter (Signed)
Medication refill for one time only.  Patient needs to be seen.  Letter sent for patient to call and schedule 

## 2013-09-11 ENCOUNTER — Encounter: Payer: Self-pay | Admitting: Physician Assistant

## 2013-09-11 ENCOUNTER — Ambulatory Visit (INDEPENDENT_AMBULATORY_CARE_PROVIDER_SITE_OTHER): Payer: BC Managed Care – PPO | Admitting: Physician Assistant

## 2013-09-11 VITALS — BP 126/78 | HR 80 | Temp 97.6°F | Resp 18 | Wt 202.0 lb

## 2013-09-11 DIAGNOSIS — M546 Pain in thoracic spine: Secondary | ICD-10-CM

## 2013-09-11 DIAGNOSIS — F419 Anxiety disorder, unspecified: Secondary | ICD-10-CM

## 2013-09-11 DIAGNOSIS — F411 Generalized anxiety disorder: Secondary | ICD-10-CM

## 2013-09-11 DIAGNOSIS — F329 Major depressive disorder, single episode, unspecified: Secondary | ICD-10-CM

## 2013-09-11 DIAGNOSIS — M549 Dorsalgia, unspecified: Secondary | ICD-10-CM

## 2013-09-11 DIAGNOSIS — F32A Depression, unspecified: Secondary | ICD-10-CM

## 2013-09-11 MED ORDER — ALPRAZOLAM 0.5 MG PO TABS: ORAL_TABLET | ORAL | Status: DC

## 2013-09-11 MED ORDER — PREDNISONE 20 MG PO TABS: ORAL_TABLET | ORAL | Status: DC

## 2013-09-11 MED ORDER — ESCITALOPRAM OXALATE 20 MG PO TABS: 20.0000 mg | ORAL_TABLET | Freq: Every day | ORAL | Status: DC

## 2013-09-12 NOTE — Progress Notes (Signed)
Patient ID: Sean Price MRN: [id number], DOB: February 10, 1956, 57 y.o. Date of Encounter: @DATE @  Chief Complaint:  Chief Complaint  Patient presents with  . Pulled muscle in back that radiates to chest  . Depression med - no help    HPI: 57 y.o. year old white male  presents with the above complaints.  1. Anxiety/ Depression: He initially saw Dr. Tanya Nones regarding this issue on 05/09/13. At that time he reported that over the past month he had been experiencing increasing depression and anhedonia. He could not sleep at night. He could not concentrate at work. He had no appetite. He was losing weight. He had no energy. He was even having some thoughts of suicide but he had no real plan of following this through with this. At that visit Dr. Tanya Nones started Lexapro 10 mg daily and prescribed  Xanax 0.5 mg to use at night as needed for sleep.  He had followup office visit with Dr. Tanya Nones on 06/06/13. He reported that he was doing much better. He was approximately 75% better. He was sleeping better. He was having minimal suicidal thoughts and ideation. He was feeling calmer at work. He denied any anxiety or panic attacks. He gained 5 pounds due to increased appetite. He reported increased energy. He had not needed to use Xanax for one week prior to that visit. At that time Dr. Tanya Nones continued Lexapro 10 mg daily. Planned to have him follow up in 3 months to see about possibly weaning him off of the Lexapro. He plan to refill the Xanax if needed but he will continue to use this as sparingly as possible.  Today patient reports that he is feeling worse again and is feeling similar to the way he felt at his initial visit with Dr. Tanya Nones about this in July. He is still taking the Lexapro 10 mg daily.  Asked him about stressors in his life and anything that may have triggered his initial symptoms back in July or that may be triggering his symptoms now. He says that at the time of the visit in July  there was no increased stressor going on whatsoever. Stress level at his job were the same as usual. At home, as far as relationships there,things were the same as usual. There have been no deaths in the family or severe illnesses et Karie Soda. However, regarding his current symptoms he says that there is a stressor that he thinks is contributing. He says that they recently bought a second house which they are remodeling so that he can eventually move into that house. Says that he has had a lot of frustration with the contractors. 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 He reports that he works at [employer]. 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 However he finds himself at work feeling as if he doesn't even know what to do. He is having no suicidal ideations or plans at this time.  2. he also has complaints of pain in his left back around to his left chest. He points to his left back at approximately the level of T4. He says that this pain started over the weekend which would've been about 5 days ago. As the time he was doing a lot of roofing work and thinks that this is what caused it. Says that if he took no medication, the  pain would  be a constant 8/10. However he has been taking 4 ibuprofen  twice a day and this brings the pain level down to about a 3 or 4/10.   Past Medical History  Diagnosis Date  . Atrial fibrillation with RVR     new found on 12/18/10 visit  . Diabetes mellitus without complication   . Hypertension   . Hyperlipidemia   . Depression   . BPH with elevated PSA   . Myocardial infarction   . Anxiety   . History of atrial fibrillation   . GERD (gastroesophageal reflux disease)   . Sinus arrhythmia      Home Meds: See attached medication section for current medication list. Any medications entered into computer today will not appear on this note's list. The medications listed  below were entered prior to today. Current Outpatient Prescriptions on File Prior to Visit  Medication Sig Dispense Refill  . amLODipine (NORVASC) 2.5 MG tablet Take 2.5 mg by mouth daily.      [name] atorvastatin (LIPITOR) 40 MG tablet Take 1 tablet (40 mg total) by mouth at bedtime.  30 tablet  4  . Blood Glucose Monitoring Suppl (ONE TOUCH ULTRA SYSTEM KIT) W/DEVICE KIT 1 kit by Does not apply route once.  1 each  11  . Cholecalciferol (VITAMIN D-3) 5000 UNITS TABS Take by mouth daily.        . fish oil-omega-3 fatty acids 1000 MG capsule Take 500 mg by mouth daily.      [name] glucose blood test strip One touch Ultra strips #100/11 refills, lancets #1 box, Checks BS bid and DX:250.00  100 each  11  . lisinopril (PRINIVIL,ZESTRIL) 20 MG tablet Take 20 mg by mouth daily.        . metFORMIN (GLUCOPHAGE) 1000 MG tablet Take 1,000 mg by mouth 2 (two) times daily with a meal.      . ONETOUCH DELICA LANCETS 33G MISC Inject 1 each as directed daily.       No current facility-administered medications on file prior to visit.    Allergies:  Allergies  Allergen Reactions  . Naproxen Sodium     hearburn    History   Social History  . Marital Status: Married    Spouse Name: N/A    Number of Children: N/A  . Years of Education: N/A   Occupational History  . Not on file.   Social History Main Topics  . Smoking status: Former Smoker    Quit date: 05/29/1984  . Smokeless tobacco: Not on file  . Alcohol Use: 0.5 oz/week    1 drink(s) per week  . Drug Use: No  . Sexual Activity: Not on file   Other Topics Concern  . Not on file   Social History Narrative  . No narrative on file    Family History  Problem Relation Age of Onset  . Colon cancer Brother     at age 37     Review of Systems:  See HPI for pertinent ROS. All other ROS negative.    Physical Exam: Blood pressure 126/78, pulse 80, temperature 97.6 F (36.4 C), temperature source Oral, resp. rate 18, weight 202 lb (91.627  kg)., Body mass index is 28.98 kg/(m^2). General: WNWD WM. Appears in no acute distress. Head: Normocephalic, atraumatic, eyes without discharge, sclera non-icteric, nares are without discharge. Bilateral auditory canals clear, TM's are without perforation, pearly grey and translucent with reflective cone of light bilaterally. Oral cavity moist, posterior pharynx without exudate,  erythema, peritonsillar abscess, or post nasal drip.  Neck: Supple. No thyromegaly. No lymphadenopathy. Lungs: Clear bilaterally to auscultation without wheezes, rales, or rhonchi. Breathing is unlabored. Heart: RRR with S1 S2. No murmurs, rubs, or gallops. Abdomen: Soft, non-tender, non-distended with normoactive bowel sounds. No hepatomegaly. No rebound/guarding. No obvious abdominal masses. Musculoskeletal:  Strength and tone normal for age. Left back at approximate T4 level is where he points to the area of his pain. When I firmly palpate this area this does reproduce the discomfort that he is feeling. However when I paid towards the left flank and the left chest there is no reproducible pain with palpation. Neuro: Alert and oriented X 3. Moves all extremities spontaneously. Gait is normal. CNII-XII grossly in tact. Psych:  Responds to questions appropriately with a normal affect.he does not appear anxious or depressed through the visit and is very appropriate.      ASSESSMENT AND PLAN:  57 y.o. year old male with  1. Depression Since he is tolerating the Lexapro well with no adverse effects and initially he did get a response with this I will increase the dose of this rather than changing to a new medication. Explained to him that it may take several weeks for him to begin to notice any effect from the increase in dose. However if he begins to feel worse or develop any other adverse effects then he needs to followup with me immediately. Also will refill his Xanax to use as needed. He'll schedule a followup office  visit in approximately 4-5 weeks versus follow up sooner if any worsening. - escitalopram (LEXAPRO) 20 MG tablet; Take 1 tablet (20 mg total) by mouth daily.  Dispense: 30 tablet; Refill: 1 - ALPRAZolam (XANAX) 0.5 MG tablet; TAKE ONE TABLET BY MOUTH AT BEDTIME AS NEEDED FOR SLEEP  Dispense: 30 tablet; Refill: 0  2. Anxiety - escitalopram (LEXAPRO) 20 MG tablet; Take 1 tablet (20 mg total) by mouth daily.  Dispense: 30 tablet; Refill: 1 - ALPRAZolam (XANAX) 0.5 MG tablet; TAKE ONE TABLET BY MOUTH AT BEDTIME AS NEEDED FOR SLEEP  Dispense: 30 tablet; Refill: 0  3. Mid back pain on left side I think his current pain is being caused by muscle spasm and strain in his left back and there is also inflammation and irritation around a nerve. Prednisone should help with this. However I do see that he does have diabetes. I discussed with him the prednisone will increase his blood sugar and can also cause him to feel shaky and jittery et Karie Soda. Told him to be extra strict with carbohydrate intake while on the prednisone. F/u if his symptoms are not resolved after completion of prednisone. - predniSONE (DELTASONE) 20 MG tablet; Take 3 daily for 2 days, then 2 daily for 2 days, then 1 daily for 2 days.  Dispense: 12 tablet; Refill: 0   Signed, [address] [place], [place], [facility] 09/12/2013 7:35 AM

## 2013-10-10 ENCOUNTER — Ambulatory Visit: Payer: BC Managed Care – PPO | Admitting: Family Medicine

## 2013-11-14 ENCOUNTER — Telehealth: Payer: Self-pay | Admitting: *Deleted

## 2013-11-14 MED ORDER — AMLODIPINE BESYLATE 2.5 MG PO TABS: 2.5000 mg | ORAL_TABLET | Freq: Every day | ORAL | Status: DC

## 2013-11-14 NOTE — Telephone Encounter (Signed)
Rx sent in for pt.

## 2013-11-14 NOTE — Telephone Encounter (Signed)
Patients wife called requesting a refill on amlodipine for patient to be called in to [facility] in [place]. She wanted you to know that he has been out of it for a week and that they never received a letter concerning the office moving. Thanks, MI

## 2013-12-23 ENCOUNTER — Encounter: Payer: Self-pay | Admitting: Cardiology

## 2013-12-26 ENCOUNTER — Ambulatory Visit: Payer: Managed Care, Other (non HMO) | Admitting: Cardiology

## 2014-01-29 DIAGNOSIS — F319 Bipolar disorder, unspecified: Secondary | ICD-10-CM

## 2014-01-29 HISTORY — DX: Bipolar disorder, unspecified: F31.9

## 2014-02-13 ENCOUNTER — Other Ambulatory Visit: Payer: Self-pay | Admitting: Family Medicine

## 2014-03-15 ENCOUNTER — Other Ambulatory Visit: Payer: Self-pay | Admitting: Cardiology

## 2014-05-10 ENCOUNTER — Other Ambulatory Visit: Payer: Self-pay | Admitting: Family Medicine

## 2014-05-10 DIAGNOSIS — E119 Type 2 diabetes mellitus without complications: Secondary | ICD-10-CM

## 2014-05-10 DIAGNOSIS — I10 Essential (primary) hypertension: Secondary | ICD-10-CM

## 2014-05-10 DIAGNOSIS — Z79899 Other long term (current) drug therapy: Secondary | ICD-10-CM

## 2014-05-10 DIAGNOSIS — E785 Hyperlipidemia, unspecified: Secondary | ICD-10-CM

## 2014-06-02 ENCOUNTER — Telehealth: Payer: Self-pay | Admitting: Gastroenterology

## 2014-06-02 NOTE — Telephone Encounter (Signed)
Sean Bogus, PA reviewed labs and patient to be seen this week. Scheduled on 06/06/14 at 2:00 PM with Sean Savoy, NP. [name] given appointment and she will call patient.

## 2014-06-06 ENCOUNTER — Encounter: Payer: Self-pay | Admitting: Nurse Practitioner

## 2014-06-06 ENCOUNTER — Other Ambulatory Visit (INDEPENDENT_AMBULATORY_CARE_PROVIDER_SITE_OTHER): Payer: BC Managed Care – PPO

## 2014-06-06 ENCOUNTER — Ambulatory Visit (INDEPENDENT_AMBULATORY_CARE_PROVIDER_SITE_OTHER): Payer: BC Managed Care – PPO | Admitting: Nurse Practitioner

## 2014-06-06 VITALS — BP 130/80 | HR 76 | Ht 70.0 in | Wt 198.0 lb

## 2014-06-06 DIAGNOSIS — R748 Abnormal levels of other serum enzymes: Secondary | ICD-10-CM

## 2014-06-06 LAB — AMYLASE: AMYLASE: 81 U/L (ref 27–131)

## 2014-06-06 LAB — LIPASE: LIPASE: 60 U/L — AB (ref 11.0–59.0)

## 2014-06-06 NOTE — Patient Instructions (Signed)
Please go to the basement level to have your labs drawn.  We will call you with the results. 

## 2014-06-06 NOTE — Progress Notes (Signed)
     History of Present Illness:  Patient is a 58 year old male, previously cared for by Dr. Sharlett Iles. He has a history of colon polyps and is up-to-date on his screening colonoscopy.  Patient comes in today after being referred for elevated amylase and lipase found on routine physical bloodwork. Amylase was 166, lipase 142. LFTs were normal. White count normal. Hemoglobin normal.. No abdominal pain. No weight loss, at least for the last year. No nausea. He feels absolutely fine. No family history of pancreatic illnesses. Patient has never consumed much alcohol, he may have a few alcoholic beverages over a month's time.  Current Medications, Allergies, Past Medical History, Past Surgical History, Family History and Social History were reviewed in [employer] record..   Physical Exam: General: Pleasant, well developed , white male in no acute distress Head: Normocephalic and atraumatic Eyes:  sclerae anicteric, conjunctiva pink  Ears: Normal auditory acuity Lungs: Clear throughout to auscultation Heart: Regular rate and rhythm Abdomen: Soft, non distended, non-tender. No masses, no hepatomegaly. Normal bowel sounds Musculoskeletal: Symmetrical with no gross deformities  Extremities: No edema  Neurological: Alert oriented x 4, grossly nonfocal Psychological:  Alert and cooperative. Normal mood and affect  Assessment and Recommendations:   58 year old male with asymptomatic elevation in both amylase and lipase in the 140 to 160 range found on routine labs. He is completely asymptomatic with stable weight and no abdominal pain. Will repeat his pancreatic enzymes today, if still abnormal he may need abdominal imaging studies

## 2014-06-10 ENCOUNTER — Encounter: Payer: Self-pay | Admitting: *Deleted

## 2014-06-10 ENCOUNTER — Other Ambulatory Visit: Payer: Self-pay | Admitting: *Deleted

## 2014-06-10 DIAGNOSIS — R748 Abnormal levels of other serum enzymes: Secondary | ICD-10-CM

## 2014-06-10 NOTE — Progress Notes (Signed)
Agree with Ms. Guenther's assessment and plan. Gatha Mayer, MD, [facility]  Lipase still elevated  Let's get CT abdomen with contrast re: elevated pancreatic enzymes

## 2014-06-11 ENCOUNTER — Other Ambulatory Visit (INDEPENDENT_AMBULATORY_CARE_PROVIDER_SITE_OTHER): Payer: BC Managed Care – PPO

## 2014-06-11 DIAGNOSIS — R748 Abnormal levels of other serum enzymes: Secondary | ICD-10-CM

## 2014-06-11 LAB — CREATININE, SERUM: Creatinine, Ser: 1 mg/dL (ref 0.4–1.5)

## 2014-06-11 LAB — BUN: BUN: 15 mg/dL (ref 6–23)

## 2014-06-12 ENCOUNTER — Other Ambulatory Visit: Payer: Self-pay | Admitting: *Deleted

## 2014-06-13 ENCOUNTER — Encounter: Payer: Self-pay | Admitting: Internal Medicine

## 2014-06-13 ENCOUNTER — Ambulatory Visit (INDEPENDENT_AMBULATORY_CARE_PROVIDER_SITE_OTHER)
Admission: RE | Admit: 2014-06-13 | Discharge: 2014-06-13 | Disposition: A | Discharge status: Home / Self Care | Payer: BC Managed Care – PPO | Source: Ambulatory Visit | Attending: Nurse Practitioner | Admitting: Nurse Practitioner

## 2014-06-13 DIAGNOSIS — R748 Abnormal levels of other serum enzymes: Secondary | ICD-10-CM

## 2014-06-13 MED ORDER — IOHEXOL 300 MG/ML  SOLN: 100.0000 mL | Freq: Once | INTRAMUSCULAR | Status: AC | PRN

## 2014-06-17 ENCOUNTER — Telehealth: Payer: Self-pay | Admitting: Nurse Practitioner

## 2014-06-17 ENCOUNTER — Other Ambulatory Visit: Payer: Self-pay | Admitting: Family Medicine

## 2014-06-17 NOTE — Progress Notes (Signed)
Quick Note:  Have him get amylase and lipase again in 3 months please  Cc his PCP on results and plans ______

## 2014-06-17 NOTE — Telephone Encounter (Signed)
Patient is calling for CT scan results. Please, advise.

## 2014-06-18 NOTE — Telephone Encounter (Signed)
Medication filled x1 with no refills.  

## 2014-06-20 ENCOUNTER — Other Ambulatory Visit: Payer: Self-pay | Admitting: *Deleted

## 2014-06-20 DIAGNOSIS — R748 Abnormal levels of other serum enzymes: Secondary | ICD-10-CM

## 2014-06-24 NOTE — Telephone Encounter (Signed)
Patient already knows results..see CTscan

## 2014-07-16 ENCOUNTER — Other Ambulatory Visit: Payer: Self-pay | Admitting: Family Medicine

## 2014-08-14 ENCOUNTER — Encounter: Payer: Self-pay | Admitting: *Deleted

## 2014-08-18 ENCOUNTER — Other Ambulatory Visit: Payer: Self-pay | Admitting: Family Medicine

## 2014-09-15 ENCOUNTER — Other Ambulatory Visit: Payer: Self-pay | Admitting: Physician Assistant

## 2014-09-16 NOTE — Telephone Encounter (Signed)
Refill denied.   Requires office visit before any further refills can be given.   Letter sent.

## 2016-11-01 ENCOUNTER — Emergency Department (HOSPITAL_COMMUNITY): Payer: 59

## 2016-11-01 ENCOUNTER — Emergency Department (HOSPITAL_COMMUNITY)
Admission: EM | Admit: 2016-11-01 | Discharge: 2016-11-01 | Disposition: A | Discharge status: Home / Self Care | Payer: 59 | Attending: Emergency Medicine | Admitting: Emergency Medicine

## 2016-11-01 ENCOUNTER — Encounter (HOSPITAL_COMMUNITY): Payer: Self-pay

## 2016-11-01 DIAGNOSIS — I10 Essential (primary) hypertension: Secondary | ICD-10-CM | POA: Diagnosis not present

## 2016-11-01 DIAGNOSIS — Y9389 Activity, other specified: Secondary | ICD-10-CM | POA: Diagnosis not present

## 2016-11-01 DIAGNOSIS — R079 Chest pain, unspecified: Secondary | ICD-10-CM | POA: Insufficient documentation

## 2016-11-01 DIAGNOSIS — S60222A Contusion of left hand, initial encounter: Secondary | ICD-10-CM | POA: Diagnosis not present

## 2016-11-01 DIAGNOSIS — S199XXA Unspecified injury of neck, initial encounter: Secondary | ICD-10-CM | POA: Diagnosis present

## 2016-11-01 DIAGNOSIS — Z87891 Personal history of nicotine dependence: Secondary | ICD-10-CM | POA: Insufficient documentation

## 2016-11-01 DIAGNOSIS — Y92410 Unspecified street and highway as the place of occurrence of the external cause: Secondary | ICD-10-CM | POA: Diagnosis not present

## 2016-11-01 DIAGNOSIS — S161XXA Strain of muscle, fascia and tendon at neck level, initial encounter: Secondary | ICD-10-CM | POA: Insufficient documentation

## 2016-11-01 DIAGNOSIS — E119 Type 2 diabetes mellitus without complications: Secondary | ICD-10-CM | POA: Insufficient documentation

## 2016-11-01 DIAGNOSIS — Y999 Unspecified external cause status: Secondary | ICD-10-CM | POA: Insufficient documentation

## 2016-11-01 DIAGNOSIS — Z79899 Other long term (current) drug therapy: Secondary | ICD-10-CM | POA: Insufficient documentation

## 2016-11-01 DIAGNOSIS — S20212A Contusion of left front wall of thorax, initial encounter: Secondary | ICD-10-CM | POA: Diagnosis not present

## 2016-11-01 DIAGNOSIS — I252 Old myocardial infarction: Secondary | ICD-10-CM | POA: Diagnosis not present

## 2016-11-01 LAB — BASIC METABOLIC PANEL
Anion gap: 8 (ref 5–15)
BUN: 11 mg/dL (ref 6–20)
CALCIUM: 9.1 mg/dL (ref 8.9–10.3)
CO2: 25 mmol/L (ref 22–32)
CREATININE: 0.9 mg/dL (ref 0.61–1.24)
Chloride: 103 mmol/L (ref 101–111)
GFR calc Af Amer: >60 mL/min (ref >60)
GLUCOSE: 167 mg/dL — AB (ref 65–99)
POTASSIUM: 4 mmol/L (ref 3.5–5.1)
SODIUM: 136 mmol/L (ref 135–145)

## 2016-11-01 LAB — CBC
HEMATOCRIT: 42.4 % (ref 39.0–52.0)
Hemoglobin: 14.1 g/dL (ref 13.0–17.0)
MCH: 29 pg (ref 26.0–34.0)
MCHC: 33.3 g/dL (ref 30.0–36.0)
MCV: 87.1 fL (ref 78.0–100.0)
PLATELETS: 200 10*3/uL (ref 150–400)
RBC: 4.87 MIL/uL (ref 4.22–5.81)
RDW: 15.7 % — AB (ref 11.5–15.5)
WBC: 7.9 10*3/uL (ref 4.0–10.5)

## 2016-11-01 LAB — I-STAT TROPONIN, ED: TROPONIN I, POC: 0 ng/mL (ref 0.00–0.08)

## 2016-11-01 MED ORDER — HYDROCODONE-ACETAMINOPHEN 5-325 MG PO TABS
1.0000 | ORAL_TABLET | Freq: Once | ORAL | Status: AC
  Administered 2016-11-01: 1 via ORAL
  Filled 2016-11-01: qty 1

## 2016-11-01 NOTE — ED Notes (Signed)
Clean gauze and kerlex applied to left hand prior to discharge

## 2016-11-01 NOTE — ED Notes (Signed)
Pt unable to e-sign at discharge due to pad being broken
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
## 2016-11-01 NOTE — ED Notes (Signed)
Patient transported to CT 

## 2016-12-08 ENCOUNTER — Ambulatory Visit (INDEPENDENT_AMBULATORY_CARE_PROVIDER_SITE_OTHER): Payer: 59 | Admitting: Family Medicine

## 2016-12-08 ENCOUNTER — Encounter (INDEPENDENT_AMBULATORY_CARE_PROVIDER_SITE_OTHER): Payer: Self-pay

## 2016-12-08 ENCOUNTER — Encounter: Payer: Self-pay | Admitting: Family Medicine

## 2016-12-08 VITALS — BP 162/103 | HR 89 | Temp 97.2°F | Ht 70.0 in | Wt 197.6 lb

## 2016-12-08 DIAGNOSIS — F32A Depression, unspecified: Secondary | ICD-10-CM

## 2016-12-08 DIAGNOSIS — R7309 Other abnormal glucose: Secondary | ICD-10-CM | POA: Diagnosis not present

## 2016-12-08 DIAGNOSIS — I10 Essential (primary) hypertension: Secondary | ICD-10-CM

## 2016-12-08 DIAGNOSIS — N400 Enlarged prostate without lower urinary tract symptoms: Secondary | ICD-10-CM | POA: Diagnosis not present

## 2016-12-08 DIAGNOSIS — F320 Major depressive disorder, single episode, mild: Secondary | ICD-10-CM | POA: Insufficient documentation

## 2016-12-08 DIAGNOSIS — R972 Elevated prostate specific antigen [PSA]: Secondary | ICD-10-CM

## 2016-12-08 DIAGNOSIS — Z1159 Encounter for screening for other viral diseases: Secondary | ICD-10-CM

## 2016-12-08 DIAGNOSIS — F329 Major depressive disorder, single episode, unspecified: Secondary | ICD-10-CM

## 2016-12-08 DIAGNOSIS — F909 Attention-deficit hyperactivity disorder, unspecified type: Secondary | ICD-10-CM | POA: Insufficient documentation

## 2016-12-08 DIAGNOSIS — F418 Other specified anxiety disorders: Secondary | ICD-10-CM

## 2016-12-08 DIAGNOSIS — F419 Anxiety disorder, unspecified: Secondary | ICD-10-CM

## 2016-12-08 DIAGNOSIS — E782 Mixed hyperlipidemia: Secondary | ICD-10-CM

## 2016-12-08 MED ORDER — LISINOPRIL 20 MG PO TABS: 20.0000 mg | ORAL_TABLET | Freq: Every day | ORAL | 1 refills | Status: DC

## 2016-12-08 MED ORDER — ATORVASTATIN CALCIUM 40 MG PO TABS: ORAL_TABLET | ORAL | 1 refills | Status: DC

## 2016-12-09 ENCOUNTER — Other Ambulatory Visit: Payer: Self-pay | Admitting: *Deleted

## 2016-12-09 DIAGNOSIS — R7309 Other abnormal glucose: Secondary | ICD-10-CM

## 2016-12-09 LAB — LIPID PANEL
CHOL/HDL RATIO: 2.5 ratio (ref 0.0–5.0)
Cholesterol, Total: 138 mg/dL (ref 100–199)
HDL: 56 mg/dL (ref >39)
LDL Calculated: 66 mg/dL (ref 0–99)
Triglycerides: 80 mg/dL (ref 0–149)
VLDL Cholesterol Cal: 16 mg/dL (ref 5–40)

## 2016-12-09 LAB — CMP14+EGFR
ALT: 19 IU/L (ref 0–44)
AST: 15 IU/L (ref 0–40)
Albumin/Globulin Ratio: 2 (ref 1.2–2.2)
Albumin: 4.5 g/dL (ref 3.5–5.5)
Alkaline Phosphatase: 122 IU/L — ABNORMAL HIGH (ref 39–117)
BILIRUBIN TOTAL: 0.5 mg/dL (ref 0.0–1.2)
BUN/Creatinine Ratio: 17 (ref 9–20)
BUN: 14 mg/dL (ref 6–24)
CHLORIDE: 100 mmol/L (ref 96–106)
CO2: 26 mmol/L (ref 18–29)
Calcium: 9.7 mg/dL (ref 8.7–10.2)
Creatinine, Ser: 0.82 mg/dL (ref 0.76–1.27)
GFR, EST AFRICAN AMERICAN: 112 mL/min/{1.73_m2} (ref >59)
GFR, EST NON AFRICAN AMERICAN: 97 mL/min/{1.73_m2} (ref >59)
GLOBULIN, TOTAL: 2.2 g/dL (ref 1.5–4.5)
Glucose: 111 mg/dL — ABNORMAL HIGH (ref 65–99)
POTASSIUM: 4.5 mmol/L (ref 3.5–5.2)
SODIUM: 141 mmol/L (ref 134–144)
Total Protein: 6.7 g/dL (ref 6.0–8.5)

## 2016-12-09 LAB — PSA, TOTAL AND FREE
PSA FREE: 0.44 ng/mL
PSA, Free Pct: 11.6 %
Prostate Specific Ag, Serum: 3.8 ng/mL (ref 0.0–4.0)

## 2016-12-09 LAB — HEPATITIS C ANTIBODY: Hep C Virus Ab: <0.1 s/co ratio (ref 0.0–0.9)

## 2016-12-09 LAB — BAYER DCA HB A1C WAIVED: HB A1C: 6.8 % (ref <7.0)

## 2016-12-12 NOTE — Progress Notes (Signed)
BP (!) 162/103   Pulse 89   Temp 97.2 F (36.2 C) (Oral)   Ht _0  (1.778 m)   Wt 197 lb 9.6 oz (89.6 kg)   BMI 28.35 kg/m    Subjective:    Patient ID: Sean Price, male    DOB: 1955-11-30, 61 y.o.   MRN: [id number]  HPI: Sean Price is a 61 y.o. male presenting on 12/08/2016 for Establish Care   HPI Anxiety and depression Patient is coming in today to establish care with our office. He is coming in for anxiety and depression. In the past he has been on Klonopin and Adderall and Lexapro. He says he was using the Klonopin for sleep and Adderall for attention issues and he says both of those have not been a problem since his been off the medication for some time. He says Lexapro is been working the best for him and he would like to continue that. He denies any suicidal ideations or thoughts of hurting himself. He has been sleeping at night now.  Hyperlipidemia checkup Patient is currently on Lipitor 40 mg and omega-3 and says is been at least a year since his had a checked with his previous physician. He denies any issues or myalgias with this medication.   Hypertension checkup Patient is coming in to establish care for hypertension. He says that previously he was on lisinopril 20 mg. He has been out of it for couple weeks and needs refills for. He says is been on it for sometime and it has been controlling his blood pressure fine. Patient denies headaches, blurred vision, chest pains, shortness of breath, or weakness. Denies any side effects from medication and is content with current medication.   Patient is also coming in to get fasting labs and previously he said he's had fasting glucose that has been elevated and wants to have that checked. He says his also had some elevated PSAs before and had prostate biopsies and is due for recheck on that as well.  Relevant past medical, surgical, family and social history reviewed and updated as indicated. Interim medical history  since our last visit reviewed. Allergies and medications reviewed and updated.  Review of Systems  Constitutional: Negative for chills and fever.  Eyes: Negative for discharge.  Respiratory: Negative for shortness of breath and wheezing.   Cardiovascular: Negative for chest pain and leg swelling.  Endocrine: Negative for cold intolerance and heat intolerance.  Musculoskeletal: Negative for back pain and gait problem.  Skin: Negative for rash.  Psychiatric/Behavioral: Positive for decreased concentration, dysphoric mood and sleep disturbance. Negative for self-injury and suicidal ideas. The patient is nervous/anxious.   All other systems reviewed and are negative.   Per HPI unless specifically indicated above  Social History   Social History  . Marital status: Married    Spouse name: Suanne Marker  . Number of children: 3  . Years of education: N/A   Occupational History  . SCHEDULING/planner [employer]   Social History Main Topics  . Smoking status: Former Smoker    Years: 4.00    Types: Cigarettes    Quit date: 05/29/1984  . Smokeless tobacco: Never Used  . Alcohol use 0.5 oz/week    1 Standard drinks or equivalent per week     Comment: social  . Drug use: No  . Sexual activity: Yes   Other Topics Concern  . Not on file   Social History Narrative  . No narrative on file  Past Surgical History:  Procedure Laterality Date  . bilateral inguinal hernia    . CARPAL TUNNEL RELEASE  1999   left hand  . CERVICAL FUSION  2007   c5-c6  . COLONOSCOPY    . compound fracture  1999, 2001, 2001   right leg, insertion of hardware in 2001, removal of hardware 2001  . FRACTURE SURGERY    . LEG SURGERY Right    hardware removal  . PROSTATE BIOPSY     TRUS/BX  BPH only  . TONSILLECTOMY  1970    Family History  Problem Relation Age of Onset  . Colon cancer Brother 51  . Breast cancer Sister     mid 71's  . Diabetes Mother   . Heart attack Father 63     massive /died  . Other Father     blood infection    Allergies as of 12/08/2016   No Known Allergies     Medication List       Accurate as of 12/08/16 11:59 PM. Always use your most recent med list.          atorvastatin 40 MG tablet Commonly known as:  LIPITOR TAKE ONE TABLET BY MOUTH AT BEDTIME -  NEEDS  OFFICE  VISIT  &  LABS   clonazePAM 0.5 MG tablet Commonly known as:  KLONOPIN Take 0.5 mg by mouth at bedtime.   escitalopram 20 MG tablet Commonly known as:  LEXAPRO Take 1 tablet (20 mg total) by mouth daily.   Fish Oil 1200 MG Caps Take 1,200 mg by mouth daily.   glucose blood test strip One touch Ultra strips #100/11 refills, lancets #1 box, Checks BS bid and DX:250.00   lisinopril 20 MG tablet Commonly known as:  PRINIVIL,ZESTRIL Take 1 tablet (20 mg total) by mouth daily.   ONE TOUCH ULTRA SYSTEM KIT w/Device Kit 1 kit by Does not apply route once.          Objective:    BP (!) 162/103   Pulse 89   Temp 97.2 F (36.2 C) (Oral)   Ht _0  (1.778 m)   Wt 197 lb 9.6 oz (89.6 kg)   BMI 28.35 kg/m   Wt Readings from Last 3 Encounters:  12/08/16 197 lb 9.6 oz (89.6 kg)  11/01/16 198 lb (89.8 kg)  06/06/14 198 lb (89.8 kg)    Physical Exam  Constitutional: He is oriented to person, place, and time. He appears well-developed and well-nourished. No distress.  Eyes: Conjunctivae are normal. Right eye exhibits no discharge. Left eye exhibits no discharge. No scleral icterus.  Neck: Neck supple. No thyromegaly present.  Cardiovascular: Normal rate, regular rhythm, normal heart sounds and intact distal pulses.   No murmur heard. Pulmonary/Chest: Effort normal and breath sounds normal. No respiratory distress. He has no wheezes. He has no rales.  Musculoskeletal: Normal range of motion. He exhibits no edema.  Lymphadenopathy:    He has no cervical adenopathy.  Neurological: He is alert and oriented to person, place, and time. Coordination normal.  Skin:  Skin is warm and dry. No rash noted. He is not diaphoretic.  Psychiatric: He has a normal mood and affect. His behavior is normal.  Nursing note and vitals reviewed.     Assessment & Plan:   Problem List Items Addressed This Visit      Cardiovascular and Mediastinum   Hypertension   Relevant Medications   atorvastatin (LIPITOR) 40 MG tablet   lisinopril (PRINIVIL,ZESTRIL) 20 MG  tablet   Other Relevant Orders   CMP14+EGFR (Completed)     Genitourinary   BPH with elevated PSA   Relevant Orders   PSA, total and free (Completed)     Other   Hyperlipidemia - Primary   Relevant Medications   atorvastatin (LIPITOR) 40 MG tablet   lisinopril (PRINIVIL,ZESTRIL) 20 MG tablet   Other Relevant Orders   Lipid panel (Completed)   Anxiety and depression    Other Visit Diagnoses    Need for hepatitis C screening test       Relevant Orders   Hepatitis C antibody (Completed)   Elevated glucose           Follow up plan: Return in about 4 weeks (around 01/05/2017), or if symptoms worsen or fail to improve, for htn recheck.  Caryl Pina, MD [facility] 12/08/2016, 5:01 PM

## 2017-01-05 ENCOUNTER — Ambulatory Visit (INDEPENDENT_AMBULATORY_CARE_PROVIDER_SITE_OTHER): Payer: 59 | Admitting: Family Medicine

## 2017-01-05 ENCOUNTER — Encounter: Payer: Self-pay | Admitting: Family Medicine

## 2017-01-05 VITALS — BP 140/88 | HR 87 | Temp 97.5°F | Ht 70.0 in | Wt 203.4 lb

## 2017-01-05 DIAGNOSIS — F419 Anxiety disorder, unspecified: Secondary | ICD-10-CM | POA: Diagnosis not present

## 2017-01-05 DIAGNOSIS — F32A Depression, unspecified: Secondary | ICD-10-CM

## 2017-01-05 DIAGNOSIS — I10 Essential (primary) hypertension: Secondary | ICD-10-CM

## 2017-01-05 DIAGNOSIS — F329 Major depressive disorder, single episode, unspecified: Secondary | ICD-10-CM

## 2017-01-05 MED ORDER — ESCITALOPRAM OXALATE 20 MG PO TABS: 20.0000 mg | ORAL_TABLET | Freq: Every day | ORAL | 1 refills | Status: DC

## 2017-01-05 MED ORDER — LISINOPRIL 20 MG PO TABS: 20.0000 mg | ORAL_TABLET | Freq: Every day | ORAL | 1 refills | Status: DC

## 2017-01-05 MED ORDER — CARVEDILOL 6.25 MG PO TABS: 6.2500 mg | ORAL_TABLET | Freq: Two times a day (BID) | ORAL | 3 refills | Status: DC

## 2017-01-05 NOTE — Progress Notes (Signed)
BP (!) 140/91   Pulse 87   Temp 97.5 F (36.4 C) (Oral)   Ht '5\' 10"'  (1.778 m)   Wt 203 lb 6 oz (92.3 kg)   BMI 29.18 kg/m    Subjective:    Patient ID: Sean Price, male    DOB: 05-27-1956, 61 y.o.   MRN: [id number]  HPI: Sean Price is a 61 y.o. male presenting on 01/05/2017 for Hypertension (4 week followup)   HPI Hypertension recheck Patient is coming in for hypertension recheck. His blood pressure is 140/91 and then 140/86. He is currently on the lisinopril. Patient denies headaches, blurred vision, chest pains, shortness of breath, or weakness. Denies any side effects from medication and is content with current medication. He says at times his heart rate home runs high in the 114 and 120s, here in the office today is 86. He denies any chest pain or shortness of breath or palpitations.   Depression and anxiety recheck Patient is coming in today for depression and anxiety recheck. He says his anxiety is doing very well and he is very content with being on the Lexapro. He said coming off the Adderall for a few days he did struggle little bit but has not needed it since. He denies any suicidal ideations or thoughts of hurting himself or any issues with sleep or feelings of down are sadness or over anxiety in himself.  Relevant past medical, surgical, family and social history reviewed and updated as indicated. Interim medical history since our last visit reviewed. Allergies and medications reviewed and updated.  Review of Systems  Constitutional: Negative for chills and fever.  Respiratory: Negative for shortness of breath and wheezing.   Cardiovascular: Negative for chest pain and leg swelling.  Musculoskeletal: Negative for back pain and gait problem.  Skin: Negative for rash.  Neurological: Negative for dizziness, weakness, light-headedness, numbness and headaches.  Psychiatric/Behavioral: Negative for decreased concentration, dysphoric mood, self-injury, sleep  disturbance and suicidal ideas. The patient is not nervous/anxious.   All other systems reviewed and are negative.   Per HPI unless specifically indicated above   Allergies as of 01/05/2017   No Known Allergies     Medication List       Accurate as of 01/05/17  4:18 PM. Always use your most recent med list.          atorvastatin 40 MG tablet Commonly known as:  LIPITOR TAKE ONE TABLET BY MOUTH AT BEDTIME -  NEEDS  OFFICE  VISIT  &  LABS   carvedilol 6.25 MG tablet Commonly known as:  COREG Take 1 tablet (6.25 mg total) by mouth 2 (two) times daily with a meal.   clonazePAM 0.5 MG tablet Commonly known as:  KLONOPIN Take 0.5 mg by mouth at bedtime.   escitalopram 20 MG tablet Commonly known as:  LEXAPRO Take 1 tablet (20 mg total) by mouth daily.   Fish Oil 1200 MG Caps Take 1,200 mg by mouth daily.   glucose blood test strip One touch Ultra strips #100/11 refills, lancets #1 box, Checks BS bid and DX:250.00   lisinopril 20 MG tablet Commonly known as:  PRINIVIL,ZESTRIL Take 1 tablet (20 mg total) by mouth daily.   ONE TOUCH ULTRA SYSTEM KIT w/Device Kit 1 kit by Does not apply route once.          Objective:    BP (!) 140/91   Pulse 87   Temp 97.5 F (36.4 C) (Oral)   Ht  '5\' 10"'  (1.778 m)   Wt 203 lb 6 oz (92.3 kg)   BMI 29.18 kg/m   Wt Readings from Last 3 Encounters:  01/05/17 203 lb 6 oz (92.3 kg)  12/08/16 197 lb 9.6 oz (89.6 kg)  11/01/16 198 lb (89.8 kg)    Physical Exam  Constitutional: He is oriented to person, place, and time. He appears well-developed and well-nourished. No distress.  Eyes: Conjunctivae are normal. No scleral icterus.  Cardiovascular: Normal rate, regular rhythm, normal heart sounds and intact distal pulses.   No murmur heard. Pulmonary/Chest: Effort normal and breath sounds normal. No respiratory distress. He has no wheezes. He has no rales.  Musculoskeletal: Normal range of motion. He exhibits no edema.  Neurological:  He is alert and oriented to person, place, and time. Coordination normal.  Skin: Skin is warm and dry. No rash noted. He is not diaphoretic.  Psychiatric: He has a normal mood and affect. His behavior is normal. Judgment and thought content normal. His mood appears not anxious. He does not exhibit a depressed mood. He expresses no suicidal ideation. He expresses no suicidal plans.  Nursing note and vitals reviewed.     Assessment & Plan:   Problem List Items Addressed This Visit      Cardiovascular and Mediastinum   Hypertension - Primary   Relevant Medications   carvedilol (COREG) 6.25 MG tablet   lisinopril (PRINIVIL,ZESTRIL) 20 MG tablet    Other Visit Diagnoses    Depression, unspecified depression type       Relevant Medications   escitalopram (LEXAPRO) 20 MG tablet   Anxiety       Relevant Medications   escitalopram (LEXAPRO) 20 MG tablet       Follow up plan: Return in about 3 months (around 04/07/2017), or if symptoms worsen or fail to improve, for Hypertension and anxiety recheck.  Counseling provided for all of the vaccine components No orders of the defined types were placed in this encounter.   Caryl Pina, MD [facility] 01/05/2017, 4:18 PM

## 2017-03-08 ENCOUNTER — Other Ambulatory Visit: Payer: Self-pay | Admitting: Family Medicine

## 2017-03-08 DIAGNOSIS — F32A Depression, unspecified: Secondary | ICD-10-CM

## 2017-03-08 DIAGNOSIS — F419 Anxiety disorder, unspecified: Secondary | ICD-10-CM

## 2017-03-08 DIAGNOSIS — F329 Major depressive disorder, single episode, unspecified: Secondary | ICD-10-CM

## 2017-04-12 ENCOUNTER — Ambulatory Visit (INDEPENDENT_AMBULATORY_CARE_PROVIDER_SITE_OTHER): Payer: 59 | Admitting: Family Medicine

## 2017-04-12 ENCOUNTER — Encounter: Payer: Self-pay | Admitting: Family Medicine

## 2017-04-12 VITALS — BP 139/85 | HR 74 | Temp 98.6°F | Ht 70.0 in | Wt 206.0 lb

## 2017-04-12 DIAGNOSIS — E119 Type 2 diabetes mellitus without complications: Secondary | ICD-10-CM | POA: Diagnosis not present

## 2017-04-12 DIAGNOSIS — I10 Essential (primary) hypertension: Secondary | ICD-10-CM

## 2017-04-12 DIAGNOSIS — E782 Mixed hyperlipidemia: Secondary | ICD-10-CM

## 2017-04-12 LAB — BAYER DCA HB A1C WAIVED: HB A1C (BAYER DCA - WAIVED): 6.7 % (ref <7.0)

## 2017-04-12 NOTE — Progress Notes (Signed)
BP 139/85   Pulse 74   Temp 98.6 F (37 C) (Oral)   Ht 5\' 10"  (1.778 m)   Wt 206 lb (93.4 kg)   BMI 29.56 kg/m    Subjective:    Patient ID: Sean Price, male    DOB: 14-Oct-1956, 61 y.o.   MRN: [id number]  HPI: Sean Price is a 61 y.o. male presenting on 04/12/2017 for Hypertension; Hyperlipidemia; and Diabetes   HPI Hypertension Patient is currently on Coreg and lisinopril, and her blood pressure today is 139/85. Patient denies any lightheadedness or dizziness. Patient denies headaches, blurred vision, chest pains, shortness of breath, or weakness. Denies any side effects from medication and is content with current medication.   Type 2 diabetes mellitus Patient comes in today for recheck of his diabetes. Patient has been currently taking diet control and no medication. Patient is currently on an ACE inhibitor. Patient has not seen an ophthalmologist this year. Patient denies any issues with his feet.   Hyperlipidemia Patient is coming in for recheck of his hyperlipidemia. He is currently taking Lipitor and fish oil. He denies any issues with myalgias or history of liver damage from it. He denies any focal numbness or weakness or chest pain.   Relevant past medical, surgical, family and social history reviewed and updated as indicated. Interim medical history since our last visit reviewed. Allergies and medications reviewed and updated.  Review of Systems  Constitutional: Negative for chills and fever.  Eyes: Negative for discharge.  Respiratory: Negative for shortness of breath and wheezing.   Cardiovascular: Negative for chest pain and leg swelling.  Musculoskeletal: Negative for back pain and gait problem.  Skin: Negative for rash.  Neurological: Negative for dizziness, weakness, light-headedness and headaches.  All other systems reviewed and are negative.   Per HPI unless specifically indicated above        Objective:    BP 139/85   Pulse 74    Temp 98.6 F (37 C) (Oral)   Ht 5\' 10"  (1.778 m)   Wt 206 lb (93.4 kg)   BMI 29.56 kg/m   Wt Readings from Last 3 Encounters:  04/12/17 206 lb (93.4 kg)  01/05/17 203 lb 6 oz (92.3 kg)  12/08/16 197 lb 9.6 oz (89.6 kg)    Physical Exam  Constitutional: He is oriented to person, place, and time. He appears well-developed and well-nourished. No distress.  Eyes: Conjunctivae are normal. No scleral icterus.  Neck: Neck supple. No thyromegaly present.  Cardiovascular: Normal rate, regular rhythm, normal heart sounds and intact distal pulses.   No murmur heard. Pulmonary/Chest: Effort normal and breath sounds normal. No respiratory distress. He has no wheezes. He has no rales.  Musculoskeletal: Normal range of motion. He exhibits no edema.  Lymphadenopathy:    He has no cervical adenopathy.  Neurological: He is alert and oriented to person, place, and time. Coordination normal.  Skin: Skin is warm and dry. No rash noted. He is not diaphoretic.  Psychiatric: He has a normal mood and affect. His behavior is normal.  Nursing note and vitals reviewed.       Assessment & Plan:   Problem List Items Addressed This Visit      Cardiovascular and Mediastinum   Hypertension - Primary     Endocrine   Type 2 diabetes mellitus without complications ([place])   Relevant Orders   Bayer DCA Hb A1c Waived (Completed)   Microalbumin / creatinine urine ratio (Completed)  Other   Hyperlipidemia       Follow up plan: Return in about 3 months (around 07/13/2017), or if symptoms worsen or fail to improve, for Recheck diabetes and hypertension.  Counseling provided for all of the vaccine components Orders Placed This Encounter  Procedures  . Bayer DCA Hb A1c Waived  . Microalbumin / creatinine urine ratio    Caryl Pina, MD [facility] 04/12/2017, 4:43 PM

## 2017-04-13 LAB — MICROALBUMIN / CREATININE URINE RATIO
CREATININE, UR: 79 mg/dL
MICROALB/CREAT RATIO: 3.9 mg/g{creat} (ref 0.0–30.0)
MICROALBUM., U, RANDOM: 3.1 ug/mL

## 2017-05-14 ENCOUNTER — Other Ambulatory Visit: Payer: Self-pay | Admitting: Family Medicine

## 2017-05-14 DIAGNOSIS — F32A Depression, unspecified: Secondary | ICD-10-CM

## 2017-05-14 DIAGNOSIS — F419 Anxiety disorder, unspecified: Secondary | ICD-10-CM

## 2017-05-14 DIAGNOSIS — F329 Major depressive disorder, single episode, unspecified: Secondary | ICD-10-CM

## 2017-07-13 ENCOUNTER — Ambulatory Visit (INDEPENDENT_AMBULATORY_CARE_PROVIDER_SITE_OTHER): Payer: No Typology Code available for payment source | Admitting: Family Medicine

## 2017-07-13 ENCOUNTER — Encounter: Payer: Self-pay | Admitting: Family Medicine

## 2017-07-13 ENCOUNTER — Ambulatory Visit: Payer: 59 | Admitting: Family Medicine

## 2017-07-13 VITALS — BP 133/82 | HR 73 | Temp 98.1°F | Ht 70.0 in | Wt 199.2 lb

## 2017-07-13 DIAGNOSIS — E119 Type 2 diabetes mellitus without complications: Secondary | ICD-10-CM

## 2017-07-13 DIAGNOSIS — E785 Hyperlipidemia, unspecified: Secondary | ICD-10-CM | POA: Diagnosis not present

## 2017-07-13 DIAGNOSIS — E1159 Type 2 diabetes mellitus with other circulatory complications: Secondary | ICD-10-CM | POA: Diagnosis not present

## 2017-07-13 DIAGNOSIS — E1169 Type 2 diabetes mellitus with other specified complication: Secondary | ICD-10-CM

## 2017-07-13 DIAGNOSIS — I10 Essential (primary) hypertension: Secondary | ICD-10-CM | POA: Diagnosis not present

## 2017-07-13 DIAGNOSIS — I152 Hypertension secondary to endocrine disorders: Secondary | ICD-10-CM

## 2017-07-13 LAB — CMP14+EGFR
ALBUMIN: 4.4 g/dL (ref 3.6–4.8)
ALT: 12 IU/L (ref 0–44)
AST: 14 IU/L (ref 0–40)
Albumin/Globulin Ratio: 2 (ref 1.2–2.2)
Alkaline Phosphatase: 80 IU/L (ref 39–117)
BILIRUBIN TOTAL: 0.6 mg/dL (ref 0.0–1.2)
BUN/Creatinine Ratio: 16 (ref 10–24)
BUN: 15 mg/dL (ref 8–27)
CALCIUM: 9.7 mg/dL (ref 8.6–10.2)
CHLORIDE: 104 mmol/L (ref 96–106)
CO2: 23 mmol/L (ref 20–29)
Creatinine, Ser: 0.96 mg/dL (ref 0.76–1.27)
GFR, EST AFRICAN AMERICAN: 99 mL/min/{1.73_m2} (ref >59)
GFR, EST NON AFRICAN AMERICAN: 86 mL/min/{1.73_m2} (ref >59)
Globulin, Total: 2.2 g/dL (ref 1.5–4.5)
Glucose: 121 mg/dL — ABNORMAL HIGH (ref 65–99)
POTASSIUM: 4.7 mmol/L (ref 3.5–5.2)
Sodium: 140 mmol/L (ref 134–144)
TOTAL PROTEIN: 6.6 g/dL (ref 6.0–8.5)

## 2017-07-13 LAB — LIPID PANEL
CHOL/HDL RATIO: 2.9 ratio (ref 0.0–5.0)
Cholesterol, Total: 147 mg/dL (ref 100–199)
HDL: 51 mg/dL (ref >39)
LDL Calculated: 80 mg/dL (ref 0–99)
Triglycerides: 79 mg/dL (ref 0–149)
VLDL CHOLESTEROL CAL: 16 mg/dL (ref 5–40)

## 2017-07-13 LAB — BAYER DCA HB A1C WAIVED: HB A1C: 6.4 % (ref <7.0)

## 2017-07-13 NOTE — Progress Notes (Signed)
BP 133/82   Pulse 73   Temp 98.1 F (36.7 C) (Oral)   Ht '5\' 10"'  (1.778 m)   Wt 199 lb 4 oz (90.4 kg)   BMI 28.59 kg/m    Subjective:    Patient ID: Sean Price, male    DOB: 1956-04-17, 61 y.o.   MRN: [id number]  HPI: Sean Price is a 61 y.o. male presenting on 07/13/2017 for Diabetes (followup; patient is fasting); Hyperlipidemia; and Hypertension   HPI Hypertension Patient is currently on Coreg and lisinopril, and their blood pressure today is 133/82. Patient denies any lightheadedness or dizziness. Patient denies headaches, blurred vision, chest pains, shortness of breath, or weakness. Denies any side effects from medication and is content with current medication.   Hyperlipidemia Patient is coming in for recheck of his hyperlipidemia. The patient is currently taking atorvastatin and fish oil. They deny any issues with myalgias or history of liver damage from it. They deny any focal numbness or weakness or chest pain.   Type 2 diabetes mellitus Patient comes in today for recheck of his diabetes. Patient has been currently taking diet control. Patient is currently on an ACE inhibitor/ARB. Patient has not seen an ophthalmologist this year. Patient denies any issues with their feet.   Relevant past medical, surgical, family and social history reviewed and updated as indicated. Interim medical history since our last visit reviewed. Allergies and medications reviewed and updated.  Review of Systems  Constitutional: Negative for chills and fever.  Eyes: Negative for discharge.  Respiratory: Negative for shortness of breath and wheezing.   Cardiovascular: Negative for chest pain and leg swelling.  Musculoskeletal: Negative for back pain, gait problem and myalgias.  Skin: Negative for rash.  Neurological: Negative for dizziness, weakness, light-headedness, numbness and headaches.  All other systems reviewed and are negative.   Per HPI unless specifically indicated  above     Objective:    BP 133/82   Pulse 73   Temp 98.1 F (36.7 C) (Oral)   Ht '5\' 10"'  (1.778 m)   Wt 199 lb 4 oz (90.4 kg)   BMI 28.59 kg/m   Wt Readings from Last 3 Encounters:  07/13/17 199 lb 4 oz (90.4 kg)  04/12/17 206 lb (93.4 kg)  01/05/17 203 lb 6 oz (92.3 kg)    Physical Exam  Constitutional: He is oriented to person, place, and time. He appears well-developed and well-nourished. No distress.  Eyes: Conjunctivae are normal. No scleral icterus.  Neck: Neck supple. No thyromegaly present.  Cardiovascular: Normal rate, regular rhythm, normal heart sounds and intact distal pulses.   No murmur heard. Pulmonary/Chest: Effort normal and breath sounds normal. No respiratory distress. He has no wheezes. He has no rales.  Musculoskeletal: Normal range of motion. He exhibits no edema.  Lymphadenopathy:    He has no cervical adenopathy.  Neurological: He is alert and oriented to person, place, and time. Coordination normal.  Skin: Skin is warm and dry. No rash noted. He is not diaphoretic.  Psychiatric: He has a normal mood and affect. His behavior is normal.  Nursing note and vitals reviewed.     Assessment & Plan:   Problem List Items Addressed This Visit      Cardiovascular and Mediastinum   Hypertension associated with diabetes ([place])   Relevant Orders   CMP14+EGFR (Completed)     Endocrine   Type 2 diabetes mellitus without complications ([place]) - Primary   Relevant Orders   CMP14+EGFR (Completed)  Bayer DCA Hb A1c Waived (Completed)   Hyperlipidemia associated with type 2 diabetes mellitus ([place])   Relevant Orders   Lipid panel (Completed)       Follow up plan: Return in about 3 months (around 10/12/2017), or if symptoms worsen or fail to improve, for Diabetes hypertension.  Counseling provided for all of the vaccine components Orders Placed This Encounter  Procedures  . CMP14+EGFR  . Lipid panel  . Bayer [facility] Hb A1c [place],  MD [facility] 07/13/2017, 11:35 AM

## 2017-08-01 ENCOUNTER — Other Ambulatory Visit: Payer: Self-pay | Admitting: Family Medicine

## 2017-08-01 DIAGNOSIS — F329 Major depressive disorder, single episode, unspecified: Secondary | ICD-10-CM

## 2017-08-01 DIAGNOSIS — F32A Depression, unspecified: Secondary | ICD-10-CM

## 2017-08-01 DIAGNOSIS — F419 Anxiety disorder, unspecified: Secondary | ICD-10-CM

## 2017-08-09 ENCOUNTER — Ambulatory Visit (INDEPENDENT_AMBULATORY_CARE_PROVIDER_SITE_OTHER): Payer: No Typology Code available for payment source | Admitting: Family Medicine

## 2017-08-09 ENCOUNTER — Encounter: Payer: Self-pay | Admitting: Family Medicine

## 2017-08-09 VITALS — BP 143/86 | HR 80 | Temp 97.4°F | Ht 70.0 in | Wt 201.0 lb

## 2017-08-09 DIAGNOSIS — G5601 Carpal tunnel syndrome, right upper limb: Secondary | ICD-10-CM

## 2017-08-09 DIAGNOSIS — L0291 Cutaneous abscess, unspecified: Secondary | ICD-10-CM | POA: Diagnosis not present

## 2017-08-09 DIAGNOSIS — F320 Major depressive disorder, single episode, mild: Secondary | ICD-10-CM | POA: Diagnosis not present

## 2017-08-09 MED ORDER — BUPROPION HCL ER (XL) 150 MG PO TB24: 150.0000 mg | ORAL_TABLET | Freq: Every day | ORAL | 2 refills | Status: DC

## 2017-08-09 MED ORDER — SULFAMETHOXAZOLE-TRIMETHOPRIM 800-160 MG PO TABS: 1.0000 | ORAL_TABLET | Freq: Two times a day (BID) | ORAL | 0 refills | Status: DC

## 2017-08-09 NOTE — Progress Notes (Signed)
BP (!) 143/86   Pulse 80   Temp (!) 97.4 F (36.3 C) (Oral)   Ht 5\' 10"  (1.778 m)   Wt 201 lb (91.2 kg)   BMI 28.84 kg/m    Subjective:    Patient ID: Sean Price, male    DOB: 1955-11-21, 61 y.o.   MRN: [id number]  HPI: Sean Price is a 61 y.o. male presenting on 08/09/2017 for lump / cyst on chest and Follow-up (loose stools, cough, sweats, cramps, neck pain)   HPI Chest wall anterior abscess Patient has a chest wall anterior cyst that has become an abscess and become red and warm and started to drain recently. He said he drained about 2 days ago and then has become red and hot and painful again. He's also been having some loose stools and sweats and cramps and muscle aches and neck pain associated with when he assumes are achiness from fevers and chills.  Depression Patient comes in with complaints of depression and anxiety. He has been on Lexapro previously. Just feels down all the time he feels anxious and he feels stressed. He denies any suicidal ideations or thoughts of hurting himself. Depression screen [facility] 2/9 08/09/2017 07/13/2017 04/12/2017 01/05/2017 12/08/2016  Decreased Interest 3 0 0 0 2  Down, Depressed, Hopeless 3 0 0 0 1  PHQ - 2 Score 6 0 0 0 3  Altered sleeping 3 - - - 3  Tired, decreased energy 1 - - - 1  Change in appetite 1 - - - 1  Feeling bad or failure about yourself  1 - - - 1  Trouble concentrating 1 - - - 2  Moving slowly or fidgety/restless 2 - - - 0  Suicidal thoughts 1 - - - 1  PHQ-9 Score 16 - - - 12  Difficult doing work/chores Somewhat difficult - - - -   Right-sided hand numbness The patient complains of right-sided And has been feeling in mostly the thumb and first 2 fingers of his right hand. It is very similar to when his previously had carpal tunnel of left hand. He had a repaired in his left hand but he is starting to only in his right hand over the past month. He denies any weakness. He does not want an associated doing  anything about it at this point but would like to discuss in the future going to see an orthopedic for help with this.  Relevant past medical, surgical, family and social history reviewed and updated as indicated. Interim medical history since our last visit reviewed. Allergies and medications reviewed and updated.  Review of Systems  Constitutional: Negative for chills and fever.  Eyes: Negative for discharge.  Respiratory: Negative for shortness of breath and wheezing.   Cardiovascular: Negative for chest pain and leg swelling.  Musculoskeletal: Negative for back pain and gait problem.  Skin: Positive for color change. Negative for rash.  Neurological: Positive for numbness.  Psychiatric/Behavioral: Positive for dysphoric mood. The patient is nervous/anxious.   All other systems reviewed and are negative.   Per HPI unless specifically indicated above      Objective:    BP (!) 143/86   Pulse 80   Temp (!) 97.4 F (36.3 C) (Oral)   Ht 5\' 10"  (1.778 m)   Wt 201 lb (91.2 kg)   BMI 28.84 kg/m   Wt Readings from Last 3 Encounters:  08/09/17 201 lb (91.2 kg)  07/13/17 199 lb 4 oz (90.4 kg)  04/12/17  206 lb (93.4 kg)    Physical Exam  Constitutional: He is oriented to person, place, and time. He appears well-developed and well-nourished. No distress.  Eyes: Conjunctivae are normal. No scleral icterus.  Neck: Neck supple. No thyromegaly present.  Cardiovascular: Normal rate, regular rhythm, normal heart sounds and intact distal pulses.   No murmur heard. Pulmonary/Chest: Effort normal and breath sounds normal. No respiratory distress. He has no wheezes. He has no rales.  Musculoskeletal: Normal range of motion. He exhibits no edema.  Lymphadenopathy:    He has no cervical adenopathy.  Neurological: He is alert and oriented to person, place, and time. A sensory deficit (Numbness in the thumb and first 2 fingers on right hand, no decreased grip strength) is present. Coordination  normal.  Skin: Skin is warm and dry. Lesion noted. No rash noted. He is not diaphoretic. There is erythema.  Psychiatric: He has a normal mood and affect. His behavior is normal.  Nursing note and vitals reviewed.   I&D: Region was anesthetized with topical and chloride. Incision was made on anterior medial aspect of the abscess. Significant serosanguineous and purulent drainage was exuded. Culture was taken. Simple dressing was placed over top. Bleeding was minimal and patient tolerated procedure well.      Assessment & Plan:   Problem List Items Addressed This Visit      Other   Depression, major, single episode, mild (HCC)   Relevant Medications   buPROPion (WELLBUTRIN XL) 150 MG 24 hr tablet    Other Visit Diagnoses    Abscess    -  Primary   On Center of chest, did I&D   Relevant Medications   sulfamethoxazole-trimethoprim (BACTRIM DS,SEPTRA DS) 800-160 MG tablet   Carpal tunnel syndrome on right       Relevant Medications   buPROPion (WELLBUTRIN XL) 150 MG 24 hr tablet       Follow up plan: Return in about 2 months (around 10/09/2017), or if symptoms worsen or fail to improve, for Recheck anxiety and depression and diabetes.  Counseling provided for all of the vaccine components No orders of the defined types were placed in this encounter.   Caryl Pina, MD [facility] 08/09/2017, 10:03 AM

## 2017-08-15 ENCOUNTER — Telehealth: Payer: Self-pay | Admitting: Family Medicine

## 2017-08-15 NOTE — Telephone Encounter (Signed)
Patient states that this morning he had a headache and hot flashes after taking welbutrin.  Patient states that he is feeling better now

## 2017-09-11 ENCOUNTER — Encounter: Payer: Self-pay | Admitting: *Deleted

## 2017-10-09 ENCOUNTER — Ambulatory Visit: Payer: No Typology Code available for payment source | Admitting: Family Medicine

## 2017-10-11 ENCOUNTER — Ambulatory Visit: Payer: No Typology Code available for payment source | Admitting: Family Medicine

## 2017-11-29 ENCOUNTER — Encounter: Payer: Self-pay | Admitting: Family Medicine

## 2017-11-29 ENCOUNTER — Ambulatory Visit (INDEPENDENT_AMBULATORY_CARE_PROVIDER_SITE_OTHER): Payer: Self-pay | Admitting: Family Medicine

## 2017-11-29 VITALS — BP 162/109 | HR 91 | Temp 98.1°F | Ht 70.0 in | Wt 209.0 lb

## 2017-11-29 DIAGNOSIS — E1159 Type 2 diabetes mellitus with other circulatory complications: Secondary | ICD-10-CM

## 2017-11-29 DIAGNOSIS — E785 Hyperlipidemia, unspecified: Secondary | ICD-10-CM

## 2017-11-29 DIAGNOSIS — I10 Essential (primary) hypertension: Secondary | ICD-10-CM

## 2017-11-29 DIAGNOSIS — F320 Major depressive disorder, single episode, mild: Secondary | ICD-10-CM

## 2017-11-29 DIAGNOSIS — E119 Type 2 diabetes mellitus without complications: Secondary | ICD-10-CM

## 2017-11-29 DIAGNOSIS — F419 Anxiety disorder, unspecified: Secondary | ICD-10-CM

## 2017-11-29 DIAGNOSIS — E1169 Type 2 diabetes mellitus with other specified complication: Secondary | ICD-10-CM

## 2017-11-29 DIAGNOSIS — F329 Major depressive disorder, single episode, unspecified: Secondary | ICD-10-CM

## 2017-11-29 DIAGNOSIS — I152 Hypertension secondary to endocrine disorders: Secondary | ICD-10-CM

## 2017-11-29 DIAGNOSIS — F32A Depression, unspecified: Secondary | ICD-10-CM

## 2017-11-29 MED ORDER — BUPROPION HCL ER (XL) 150 MG PO TB24: 150.0000 mg | ORAL_TABLET | Freq: Every day | ORAL | 2 refills | Status: DC

## 2017-11-29 MED ORDER — ATORVASTATIN CALCIUM 40 MG PO TABS: ORAL_TABLET | ORAL | 2 refills | Status: DC

## 2017-11-29 MED ORDER — CARVEDILOL 6.25 MG PO TABS: 6.2500 mg | ORAL_TABLET | Freq: Two times a day (BID) | ORAL | 2 refills | Status: DC

## 2017-11-29 MED ORDER — LISINOPRIL 20 MG PO TABS: 20.0000 mg | ORAL_TABLET | Freq: Every day | ORAL | 2 refills | Status: DC

## 2017-11-29 MED ORDER — ESCITALOPRAM OXALATE 20 MG PO TABS: 20.0000 mg | ORAL_TABLET | Freq: Every day | ORAL | 2 refills | Status: DC

## 2017-11-29 NOTE — Progress Notes (Signed)
BP (!) 162/109   Pulse 91   Temp 98.1 F (36.7 C) (Oral)   Ht '5\' 10"'  (1.778 m)   Wt 209 lb (94.8 kg)   BMI 29.99 kg/m    Subjective:    Patient ID: Sean Price, male    DOB: 1956/05/05, 62 y.o.   MRN: [id number]  HPI: Sean Price is a 62 y.o. male presenting on 11/29/2017 for Diabetes (follow up; patient has been off all meds except BP medication because of insurance); Hyperlipidemia; Hypertension; and Depression   HPI Type 2 diabetes mellitus Patient comes in today for recheck of his diabetes. Patient has been currently taking diet control, he has been mostly controlled up to this point. Patient is currently on an ACE inhibitor/ARB. Patient has not seen an ophthalmologist this year. Patient denies any issues with their feet.  He has been off of his lisinopril for at least a month now.  Hypertension Patient is currently on lisinopril and carvedilol but he has been off them for a month because he ran out and has had some insurance issues, and their blood pressure today is 162/109. Patient denies any lightheadedness or dizziness. Patient denies headaches, blurred vision, chest pains, shortness of breath, or weakness. Denies any side effects from medication and is content with current medication.   Hyperlipidemia Patient is coming in for recheck of his hyperlipidemia. The patient is currently taking atorvastatin and omega-3's. They deny any issues with myalgias or history of liver damage from it. They deny any focal numbness or weakness or chest pain.   Depression Patient has been very irritable and anxious recently because he has been off of his medication for 1 month.  He says there was an insurance lapse and that is why he has not come in and that is why he has been off of his medication and he says he just cannot take it anymore to where he is irritable at work and just not doing as well and he would like to get back on his medication that he was doing very well on.  He  was taking the Lexapro and Wellbutrin.  Relevant past medical, surgical, family and social history reviewed and updated as indicated. Interim medical history since our last visit reviewed. Allergies and medications reviewed and updated.  Review of Systems  Constitutional: Negative for chills and fever.  Respiratory: Negative for cough, shortness of breath and wheezing.   Cardiovascular: Negative for chest pain and leg swelling.  Musculoskeletal: Negative for back pain and gait problem.  Skin: Negative for rash.  Neurological: Negative for dizziness, weakness, light-headedness and numbness.  Psychiatric/Behavioral: Positive for dysphoric mood. Negative for self-injury, sleep disturbance and suicidal ideas. The patient is nervous/anxious.   All other systems reviewed and are negative.  Per HPI unless specifically indicated above   Allergies as of 11/29/2017   No Known Allergies     Medication List        Accurate as of 11/29/17  3:29 PM. Always use your most recent med list.          atorvastatin 40 MG tablet Commonly known as:  LIPITOR TAKE ONE TABLET BY MOUTH AT BEDTIME -  NEEDS  OFFICE  VISIT  &  LABS   buPROPion 150 MG 24 hr tablet Commonly known as:  WELLBUTRIN XL Take 1 tablet (150 mg total) by mouth daily.   carvedilol 6.25 MG tablet Commonly known as:  COREG Take 1 tablet (6.25 mg total) by mouth 2 (two)  times daily with a meal.   escitalopram 20 MG tablet Commonly known as:  LEXAPRO TAKE 1 TABLET BY MOUTH ONCE DAILY   Fish Oil 1200 MG Caps Take 1,200 mg by mouth daily.   glucose blood test strip One touch Ultra strips #100/11 refills, lancets #1 box, Checks BS bid and DX:250.00   lisinopril 20 MG tablet Commonly known as:  PRINIVIL,ZESTRIL Take 1 tablet (20 mg total) by mouth daily.   ONE TOUCH ULTRA SYSTEM KIT w/Device Kit 1 kit by Does not apply route once.   sulfamethoxazole-trimethoprim 800-160 MG tablet Commonly known as:  BACTRIM DS,SEPTRA  DS Take 1 tablet by mouth 2 (two) times daily.          Objective:    BP (!) 162/109   Pulse 91   Temp 98.1 F (36.7 C) (Oral)   Ht '5\' 10"'  (1.778 m)   Wt 209 lb (94.8 kg)   BMI 29.99 kg/m   Wt Readings from Last 3 Encounters:  11/29/17 209 lb (94.8 kg)  08/09/17 201 lb (91.2 kg)  07/13/17 199 lb 4 oz (90.4 kg)    Physical Exam  Constitutional: He is oriented to person, place, and time. He appears well-developed and well-nourished. No distress.  Eyes: Conjunctivae are normal. No scleral icterus.  Neck: Neck supple. No thyromegaly present.  Cardiovascular: Normal rate, regular rhythm, normal heart sounds and intact distal pulses.  No murmur heard. Pulmonary/Chest: Effort normal and breath sounds normal. No respiratory distress. He has no wheezes. He has no rales.  Musculoskeletal: Normal range of motion. He exhibits no edema.  Lymphadenopathy:    He has no cervical adenopathy.  Neurological: He is alert and oriented to person, place, and time. Coordination normal.  Skin: Skin is warm and dry. No rash noted. He is not diaphoretic.  Psychiatric: He has a normal mood and affect. His behavior is normal.  Nursing note and vitals reviewed.       Assessment & Plan:   Problem List Items Addressed This Visit      Cardiovascular and Mediastinum   Hypertension associated with diabetes ([place])   Relevant Medications   atorvastatin (LIPITOR) 40 MG tablet   carvedilol (COREG) 6.25 MG tablet   lisinopril (PRINIVIL,ZESTRIL) 20 MG tablet     Endocrine   Type 2 diabetes mellitus without complications (HCC) - Primary   Relevant Medications   atorvastatin (LIPITOR) 40 MG tablet   lisinopril (PRINIVIL,ZESTRIL) 20 MG tablet   Hyperlipidemia associated with type 2 diabetes mellitus (HCC)   Relevant Medications   atorvastatin (LIPITOR) 40 MG tablet   carvedilol (COREG) 6.25 MG tablet   lisinopril (PRINIVIL,ZESTRIL) 20 MG tablet     Other   Depression, major, single episode, mild  (HCC)   Relevant Medications   buPROPion (WELLBUTRIN XL) 150 MG 24 hr tablet   escitalopram (LEXAPRO) 20 MG tablet    Other Visit Diagnoses    Essential hypertension       Relevant Medications   atorvastatin (LIPITOR) 40 MG tablet   carvedilol (COREG) 6.25 MG tablet   lisinopril (PRINIVIL,ZESTRIL) 20 MG tablet   Depression, unspecified depression type       Relevant Medications   buPROPion (WELLBUTRIN XL) 150 MG 24 hr tablet   escitalopram (LEXAPRO) 20 MG tablet   Anxiety       Relevant Medications   buPROPion (WELLBUTRIN XL) 150 MG 24 hr tablet   escitalopram (LEXAPRO) 20 MG tablet       Follow up plan: Return  in about 3 months (around 02/27/2018), or if symptoms worsen or fail to improve, for Diabetes recheck.  Counseling provided for all of the vaccine components Orders Placed This Encounter  Procedures  . Bayer DCA Hb A1c Waived  . Lipid panel  . [place], MD [facility] 11/29/2017, 3:29 PM

## 2018-04-02 ENCOUNTER — Encounter: Payer: Self-pay | Admitting: Family Medicine

## 2018-04-02 ENCOUNTER — Ambulatory Visit (INDEPENDENT_AMBULATORY_CARE_PROVIDER_SITE_OTHER): Payer: BLUE CROSS/BLUE SHIELD | Admitting: Family Medicine

## 2018-04-02 VITALS — BP 136/90 | HR 72 | Temp 98.0°F | Ht 70.0 in | Wt 217.0 lb

## 2018-04-02 DIAGNOSIS — G5601 Carpal tunnel syndrome, right upper limb: Secondary | ICD-10-CM

## 2018-04-02 DIAGNOSIS — R1031 Right lower quadrant pain: Secondary | ICD-10-CM | POA: Diagnosis not present

## 2018-04-02 NOTE — Progress Notes (Signed)
BP (!) 180/105   Pulse 72   Temp 98 F (36.7 C) (Oral)   Ht _0  (1.778 m)   Wt 217 lb (98.4 kg)   BMI 31.14 kg/m    Subjective:    Patient ID: Sean Price, male    DOB: 01-30-1956, 62 y.o.   MRN: [id number]  HPI: Sean Price is a 62 y.o. male presenting on 04/02/2018 for Carpal tunnel has worsened in right hand (at time unable to even hold a pencil)   HPI Right hand numbness and grip strength loss Patient has had previous left hand carpal tunnel repair but now he feels like he starting to develop in the right hand.  He says that he is starting to have troubles with grip strength in his right hand and he has dropped a wrench and having more difficulties writing because of the grip strength and the numbness in his fingers on the hand.  He has numbness in the first 3 fingers on his right hand.  He denies any redness or warmth or swelling.  He has been using anti-inflammatories and his braces for it without much success.  He is ready to go see orthopedic for repair.  He says that he has been having more troubles with this over the past 2 weeks  Right groin pain Patient comes in today with right groin pain that has been bothering him more over the past 5 days.  He says 5 days ago he was in his yard lifting pails of water that were 6 gallons and since that time he has been having more issues with his right groin.  He says that it is improving since 5 days ago and he has been taking anti-inflammatories for it.  He denies any pain radiating anywhere else except that right inguinal region.  He denies any testicular swelling or pain or pain with urination or flank tenderness.  Patient does have previous bilateral inguinal hernia repair and he was just concerned that this was starting to feel like it had previously.  He denies noticing any bulging or swelling.  Relevant past medical, surgical, family and social history reviewed and updated as indicated. Interim medical history since our  last visit reviewed. Allergies and medications reviewed and updated.  Review of Systems  Constitutional: Negative for chills and fever.  Respiratory: Negative for shortness of breath and wheezing.   Cardiovascular: Negative for chest pain and leg swelling.  Gastrointestinal: Positive for abdominal pain.  Musculoskeletal: Positive for arthralgias. Negative for back pain and gait problem.  Skin: Negative for rash.  All other systems reviewed and are negative.   Per HPI unless specifically indicated above   Allergies as of 04/02/2018   No Known Allergies     Medication List        Accurate as of 04/02/18  3:51 PM. Always use your most recent med list.          atorvastatin 40 MG tablet Commonly known as:  LIPITOR TAKE ONE TABLET BY MOUTH AT BEDTIME -  NEEDS  OFFICE  VISIT  &  LABS   buPROPion 150 MG 24 hr tablet Commonly known as:  WELLBUTRIN XL Take 1 tablet (150 mg total) by mouth daily.   carvedilol 6.25 MG tablet Commonly known as:  COREG Take 1 tablet (6.25 mg total) by mouth 2 (two) times daily with a meal.   escitalopram 20 MG tablet Commonly known as:  LEXAPRO Take 1 tablet (20 mg total) by mouth  daily.   Fish Oil 1200 MG Caps Take 1,200 mg by mouth daily.   glucose blood test strip One touch Ultra strips #100/11 refills, lancets #1 box, Checks BS bid and DX:250.00   lisinopril 20 MG tablet Commonly known as:  PRINIVIL,ZESTRIL Take 1 tablet (20 mg total) by mouth daily.   ONE TOUCH ULTRA SYSTEM KIT w/Device Kit 1 kit by Does not apply route once.          Objective:    BP (!) 180/105   Pulse 72   Temp 98 F (36.7 C) (Oral)   Ht _0  (1.778 m)   Wt 217 lb (98.4 kg)   BMI 31.14 kg/m   Wt Readings from Last 3 Encounters:  04/02/18 217 lb (98.4 kg)  11/29/17 209 lb (94.8 kg)  08/09/17 201 lb (91.2 kg)    Physical Exam  Constitutional: He is oriented to person, place, and time. He appears well-developed and well-nourished. No distress.    Eyes: Conjunctivae are normal. No scleral icterus.  Musculoskeletal: Normal range of motion. He exhibits tenderness (Carpal tunnel tenderness on the right hand, positive Tinel's, slight decrease in grip strength compared to the other side, 4 out of 5). He exhibits no edema or deformity.  Neurological: He is alert and oriented to person, place, and time. Coordination normal.  Skin: Skin is warm and dry. No rash noted. He is not diaphoretic.  Psychiatric: He has a normal mood and affect. His behavior is normal.  Nursing note and vitals reviewed.       Assessment & Plan:   Problem List Items Addressed This Visit    None    Visit Diagnoses    Right carpal tunnel syndrome    -  Primary   Will refer to Raliegh Ip, that is who did his previous left carpal tunnel repair   Relevant Orders   Ambulatory referral to Orthopedic Surgery   Right groin pain       Mild on exam, likely due to muscular strain, no palpable hernia, recommend ice and anti-inflammatories       Follow up plan: Return if symptoms worsen or fail to improve.  Counseling provided for all of the vaccine components Orders Placed This Encounter  Procedures  . Ambulatory referral to [place], MD [facility] 04/02/2018, 3:51 PM

## 2018-04-05 DIAGNOSIS — G5601 Carpal tunnel syndrome, right upper limb: Secondary | ICD-10-CM | POA: Diagnosis not present

## 2018-04-09 ENCOUNTER — Encounter: Payer: Self-pay | Admitting: Neurology

## 2018-04-10 ENCOUNTER — Other Ambulatory Visit: Payer: Self-pay | Admitting: *Deleted

## 2018-04-10 DIAGNOSIS — G5601 Carpal tunnel syndrome, right upper limb: Secondary | ICD-10-CM

## 2018-04-17 ENCOUNTER — Ambulatory Visit (INDEPENDENT_AMBULATORY_CARE_PROVIDER_SITE_OTHER): Payer: BLUE CROSS/BLUE SHIELD | Admitting: Neurology

## 2018-04-17 DIAGNOSIS — G5601 Carpal tunnel syndrome, right upper limb: Secondary | ICD-10-CM

## 2018-04-17 NOTE — Procedures (Signed)
[facility]  [address] [place], [place]  [place], [place] [address] Tel: [phone] Fax:  [phone] Test Date:  04/17/2018  Patient: Sean Price DOB: 29-Apr-1956 Physician: Narda Amber, DO  Sex: Male Height: 5\' 10"  Ref Phys: Berle Mull, MD  ID#: [id number] Temp: 34.8C Technician:    Patient Complaints: This is a 62 year old gentleman with history of ACDF C5-C7 referred for evaluation of right hand numbness and tingling.  NCV & EMG Findings: Extensive electrodiagnostic testing of the right upper extremity shows:  1. Right median sensory response shows prolonged distal peak latency (4.9 ms) and reduced amplitude (5.1 V).  Right ulnar sensory response is within normal limits.  2. 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 Right ulnar motor responses within normal limits. 3. Chronic motor axon loss changes were isolated to the right abductor pollicis brevis muscle, without accompanied active denervation.  Impression: Right median neuropathy at or distal to the wrist, consistent with the clinical diagnosis of carpal tunnel syndrome. Overall, these findings are severe in degree electrically.   ___________________________ Narda Amber, DO    Nerve Conduction Studies Anti Sensory Summary Table   Site NR Peak (ms) Norm Peak (ms) P-T Amp (V) Norm P-T Amp  Right Median Anti Sensory (2nd Digit)  34.8C  Wrist    4.9 <3.8 5.1 >10  Right Ulnar Anti Sensory (5th Digit)  34.8C  Wrist    2.8 <3.2 9.8 >5   Motor Summary Table   Site NR Onset (ms) Norm Onset (ms) O-P Amp (mV) Norm O-P Amp Site1 Site2 Delta-0 (ms) Dist (cm) Vel (m/s) Norm Vel (m/s)  Right Median Motor (Abd Poll Brev)   34.8C  Wrist    4.9 <4.0 3.8 >5 Elbow Wrist 4.8 32.0 67 >50  Elbow    9.7  4.7  Ulnar-wrist crossover Elbow 5.2 0.0    Ulnar-wrist crossover    4.5  2.3         Right Ulnar Motor (Abd Dig Minimi)  34.8C  Wrist    2.4 <3.1 7.6 >7 B Elbow Wrist 4.4 25.0 57 >50  B Elbow    6.8  7.4  A Elbow B Elbow 1.8 10.0 56 >50  A Elbow    8.6  7.4          EMG   Side Muscle Ins Act Fibs Psw Fasc Number Recrt Dur Dur. Amp Amp. Poly Poly. Comment  Right 1stDorInt Nml Nml Nml Nml Nml Nml Nml Nml Nml Nml Nml Nml N/A  Right Abd Poll Brev Nml Nml Nml Nml 2- Rapid Many 1+ Many 1+ Many 1+ N/A  Right PronatorTeres Nml Nml Nml Nml Nml Nml Nml Nml Nml Nml Nml Nml N/A  Right Biceps Nml Nml Nml Nml Nml Nml Nml Nml Nml Nml Nml Nml N/A  Right Triceps Nml Nml Nml Nml Nml Nml Nml Nml Nml Nml Nml Nml N/A  Right Deltoid Nml Nml Nml Nml Nml Nml Nml Nml Nml Nml Nml Nml N/A      Waveforms:

## 2018-09-07 ENCOUNTER — Other Ambulatory Visit: Payer: Self-pay | Admitting: Family Medicine

## 2018-09-07 DIAGNOSIS — F32A Depression, unspecified: Secondary | ICD-10-CM

## 2018-09-07 DIAGNOSIS — F419 Anxiety disorder, unspecified: Secondary | ICD-10-CM

## 2018-09-07 DIAGNOSIS — F320 Major depressive disorder, single episode, mild: Secondary | ICD-10-CM

## 2018-09-07 DIAGNOSIS — F329 Major depressive disorder, single episode, unspecified: Secondary | ICD-10-CM

## 2018-09-12 ENCOUNTER — Ambulatory Visit (INDEPENDENT_AMBULATORY_CARE_PROVIDER_SITE_OTHER): Payer: BLUE CROSS/BLUE SHIELD | Admitting: Family Medicine

## 2018-09-12 ENCOUNTER — Encounter: Payer: Self-pay | Admitting: Family Medicine

## 2018-09-12 VITALS — BP 119/81 | HR 86 | Temp 97.6°F | Ht 70.0 in | Wt 211.0 lb

## 2018-09-12 DIAGNOSIS — R102 Pelvic and perineal pain: Secondary | ICD-10-CM

## 2018-09-12 DIAGNOSIS — R1084 Generalized abdominal pain: Secondary | ICD-10-CM | POA: Diagnosis not present

## 2018-09-12 MED ORDER — PANTOPRAZOLE SODIUM 40 MG PO TBEC
40.0000 mg | DELAYED_RELEASE_TABLET | Freq: Every day | ORAL | 11 refills | Status: DC
Start: 2018-09-12 — End: 2019-01-17

## 2018-09-12 NOTE — Progress Notes (Signed)
Subjective:  Patient ID: Sean Price, male    DOB: 08-05-1956  Age: 62 y.o. MRN: [id number]  CC: Abdominal Pain (testicle pain started last monday)   HPI Sean Price presents for 2 different types of abdominal discomfort.  The first is one that is been present intermittently for several months and has been increasing.  He describes a groin pain that radiates into his testicles.  It is bilateral at the area of a previous hernia repair.  He had bilateral inguinal hernia repair about 10 years ago.  The pain can reach a 7-8/10.  It occurs twice a week and lasts for several hours.  There is no known association with his activity level.  Additionally he is having a discomfort in the mid abdomen.  This covers much of his mid abdomen and he has a sensation that is a ache and a feeling as if he was hungry and needed to eat.  However it is not exacerbated by eating nor is it relieved by eating.  It has been fairly constant.  Onset was with one episode of vomiting 9 days ago first thing in the morning when he drank his coffee.  He has had some dry heaves intermittently since then but no further vomiting. He has occasional indigestion, but this feels different.  Depression screen Sean Price 2/9 09/12/2018 04/02/2018 11/29/2017  Decreased Interest 1 0 2  Down, Depressed, Hopeless 1 0 2  PHQ - 2 Score 2 0 4  Altered sleeping 2 - 1  Tired, decreased energy 1 - 2  Change in appetite 1 - 1  Feeling bad or failure about yourself  1 - 2  Trouble concentrating 1 - 2  Moving slowly or fidgety/restless 0 - 0  Suicidal thoughts 0 - 2  PHQ-9 Score 8 - 14  Difficult doing work/chores - - -    History Sean Price has a past medical history of ADD (attention deficit disorder), Anxiety, Atrial fibrillation with RVR ([place]), Bipolar disorder ([place]) (01/2014), BPH with elevated PSA, Depression, Diabetes mellitus without complication ([place]), GERD (gastroesophageal reflux disease), Hyperlipidemia, Hypertension, Myocardial  infarction ([place]), and Sinus arrhythmia.   He has a past surgical history that includes bilateral inguinal hernia; Cervical fusion (2007); Carpal tunnel release (1999); compound fracture (1999, 2001, 2001); Tonsillectomy (1970); Prostate biopsy; Leg Surgery (Right); Colonoscopy; and Fracture surgery.   His family history includes Breast cancer in his sister; Colon cancer (age of onset: 34) in his brother; Diabetes in his mother; Heart attack (age of onset: 72) in his father; Other in his father.He reports that he quit smoking about 34 years ago. His smoking use included cigarettes. He quit after 4.00 years of use. He has never used smokeless tobacco. He reports that he drinks about 1.0 standard drinks of alcohol per week. He reports that he does not use drugs.    ROS Review of Systems  Constitutional: Negative for activity change, appetite change, chills, diaphoresis, fever and unexpected weight change.  HENT: Negative for rhinorrhea and trouble swallowing.   Respiratory: Negative for cough, chest tightness and shortness of breath.   Cardiovascular: Negative for chest pain.  Gastrointestinal: Positive for abdominal pain and nausea. Negative for abdominal distention, blood in stool, constipation, diarrhea, rectal pain and vomiting.  Genitourinary: Positive for testicular pain. Negative for dysuria, flank pain, hematuria and penile swelling.  Musculoskeletal: Negative for arthralgias and joint swelling.  Skin: Negative for rash.  Neurological: Negative for syncope and headaches.    Objective:  BP 119/81  Pulse 86   Temp 97.6 F (36.4 C) (Oral)   Ht 5' 10" (1.778 m)   Wt 211 lb (95.7 kg)   BMI 30.28 kg/m   BP Readings from Last 3 Encounters:  09/12/18 119/81  04/02/18 136/90  11/29/17 (!) 162/109    Wt Readings from Last 3 Encounters:  09/12/18 211 lb (95.7 kg)  04/02/18 217 lb (98.4 kg)  11/29/17 209 lb (94.8 kg)     Physical Exam  Constitutional: He is oriented to person,  place, and time. He appears well-developed and well-nourished. No distress.  HENT:  Head: Normocephalic and atraumatic.  Right Ear: External ear normal.  Left Ear: External ear normal.  Nose: Nose normal.  Mouth/Throat: Oropharynx is clear and moist.  Eyes: Pupils are equal, round, and reactive to light. Conjunctivae and EOM are normal.  Neck: Normal range of motion. Neck supple.  Cardiovascular: Normal rate, regular rhythm and normal heart sounds.  No murmur heard. Pulmonary/Chest: Effort normal and breath sounds normal. No respiratory distress. He has no wheezes. He has no rales.  Abdominal: Soft. Bowel sounds are normal. He exhibits distension. He exhibits no shifting dullness, no abdominal bruit and no mass. There is no hepatosplenomegaly. There is generalized tenderness. There is no rigidity, no rebound, no guarding and negative Murphy's sign. A hernia is present. Hernia confirmed positive in the ventral area. Hernia confirmed negative in the right inguinal area and confirmed negative in the left inguinal area.  Musculoskeletal: Normal range of motion.  Neurological: He is alert and oriented to person, place, and time. He has normal reflexes.  Skin: Skin is warm and dry.  Psychiatric: He has a normal mood and affect. His behavior is normal. Judgment and thought content normal.      Assessment & Plan:   Sean Price was seen today for abdominal pain.  Diagnoses and all orders for this visit:  Generalized abdominal pain -     CT Abdomen Pelvis W Contrast; Future  Pelvic pain -     CT Abdomen Pelvis W Contrast; Future  Other orders -     pantoprazole (PROTONIX) 40 MG tablet; Take 1 tablet (40 mg total) by mouth daily. For stomach       I am having Sean Redbird "Terry" start on pantoprazole. I am also having him maintain his glucose blood, ONE TOUCH ULTRA SYSTEM KIT, Fish Oil, atorvastatin, carvedilol, lisinopril, escitalopram, and buPROPion.  Allergies as of 09/12/2018    No Known Allergies     Medication List        Accurate as of 09/12/18  1:44 PM. Always use your most recent med list.          atorvastatin 40 MG tablet Commonly known as:  LIPITOR TAKE ONE TABLET BY MOUTH AT BEDTIME -  NEEDS  OFFICE  VISIT  &  LABS   buPROPion 150 MG 24 hr tablet Commonly known as:  WELLBUTRIN XL Take 1 tablet (150 mg total) by mouth daily. (Needs to be seen before next refill)   carvedilol 6.25 MG tablet Commonly known as:  COREG Take 1 tablet (6.25 mg total) by mouth 2 (two) times daily with a meal.   escitalopram 20 MG tablet Commonly known as:  LEXAPRO Take 1 tablet (20 mg total) by mouth daily.   Fish Oil 1200 MG Caps Take 1,200 mg by mouth daily.   glucose blood test strip One touch Ultra strips #100/11 refills, lancets #1 box, Checks BS bid and DX:250.00   lisinopril  20 MG tablet Commonly known as:  PRINIVIL,ZESTRIL Take 1 tablet (20 mg total) by mouth daily.   ONE TOUCH ULTRA SYSTEM KIT w/Device Kit 1 kit by Does not apply route once.   pantoprazole 40 MG tablet Commonly known as:  PROTONIX Take 1 tablet (40 mg total) by mouth daily. For stomach        Follow-up: Return if symptoms worsen or fail to improve.  Claretta Fraise, M.D.

## 2018-09-17 ENCOUNTER — Encounter: Payer: Self-pay | Admitting: Family Medicine

## 2018-10-08 ENCOUNTER — Telehealth: Payer: Self-pay | Admitting: Family Medicine

## 2018-10-08 NOTE — Telephone Encounter (Signed)
Pt aware you are out of office until tomorrow. He would like to come first thing in the morning so he doesn't have to try and fast until 2 which is the time of his appt. He states he will come in 12/11 if you could place orders or let me know what blood work you want ordered.

## 2018-10-09 ENCOUNTER — Other Ambulatory Visit: Payer: Self-pay | Admitting: *Deleted

## 2018-10-09 DIAGNOSIS — E119 Type 2 diabetes mellitus without complications: Secondary | ICD-10-CM

## 2018-10-09 NOTE — Telephone Encounter (Signed)
Left detailed message advising blood work placed and he can come by to have drawn fasting. To call back with any further questions or concerns.

## 2018-10-09 NOTE — Telephone Encounter (Signed)
Place an A1c and a chemistry panel for the patient

## 2018-10-10 ENCOUNTER — Other Ambulatory Visit: Payer: BLUE CROSS/BLUE SHIELD

## 2018-10-10 DIAGNOSIS — E119 Type 2 diabetes mellitus without complications: Secondary | ICD-10-CM

## 2018-10-10 LAB — CMP14+EGFR
ALBUMIN: 4.4 g/dL (ref 3.6–4.8)
ALT: 21 IU/L (ref 0–44)
AST: 14 IU/L (ref 0–40)
Albumin/Globulin Ratio: 2.2 (ref 1.2–2.2)
Alkaline Phosphatase: 86 IU/L (ref 39–117)
BUN/Creatinine Ratio: 26 — ABNORMAL HIGH (ref 10–24)
BUN: 26 mg/dL (ref 8–27)
CO2: 21 mmol/L (ref 20–29)
CREATININE: 1 mg/dL (ref 0.76–1.27)
Calcium: 9.4 mg/dL (ref 8.6–10.2)
Chloride: 103 mmol/L (ref 96–106)
GFR calc Af Amer: 93 mL/min/{1.73_m2} (ref >59)
GFR calc non Af Amer: 81 mL/min/{1.73_m2} (ref >59)
GLUCOSE: 163 mg/dL — AB (ref 65–99)
Globulin, Total: 2 g/dL (ref 1.5–4.5)
Potassium: 4.8 mmol/L (ref 3.5–5.2)
Sodium: 139 mmol/L (ref 134–144)
Total Protein: 6.4 g/dL (ref 6.0–8.5)

## 2018-10-10 LAB — BAYER DCA HB A1C WAIVED: HB A1C (BAYER DCA - WAIVED): 7.1 % — ABNORMAL HIGH (ref <7.0)

## 2018-10-11 ENCOUNTER — Encounter: Payer: BLUE CROSS/BLUE SHIELD | Admitting: Family Medicine

## 2018-10-15 ENCOUNTER — Other Ambulatory Visit: Payer: Self-pay | Admitting: Family Medicine

## 2018-10-15 DIAGNOSIS — F320 Major depressive disorder, single episode, mild: Secondary | ICD-10-CM

## 2018-10-15 DIAGNOSIS — F419 Anxiety disorder, unspecified: Secondary | ICD-10-CM

## 2018-10-15 DIAGNOSIS — F329 Major depressive disorder, single episode, unspecified: Secondary | ICD-10-CM

## 2018-10-15 DIAGNOSIS — F32A Depression, unspecified: Secondary | ICD-10-CM
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
## 2018-11-08 ENCOUNTER — Ambulatory Visit (INDEPENDENT_AMBULATORY_CARE_PROVIDER_SITE_OTHER): Payer: BLUE CROSS/BLUE SHIELD | Admitting: Family Medicine

## 2018-11-08 ENCOUNTER — Encounter: Payer: Self-pay | Admitting: Family Medicine

## 2018-11-08 VITALS — BP 128/83 | HR 81 | Temp 96.8°F | Ht 70.0 in | Wt 216.6 lb

## 2018-11-08 DIAGNOSIS — Z0001 Encounter for general adult medical examination with abnormal findings: Secondary | ICD-10-CM | POA: Diagnosis not present

## 2018-11-08 DIAGNOSIS — F419 Anxiety disorder, unspecified: Secondary | ICD-10-CM

## 2018-11-08 DIAGNOSIS — E785 Hyperlipidemia, unspecified: Secondary | ICD-10-CM

## 2018-11-08 DIAGNOSIS — I10 Essential (primary) hypertension: Secondary | ICD-10-CM

## 2018-11-08 DIAGNOSIS — F320 Major depressive disorder, single episode, mild: Secondary | ICD-10-CM

## 2018-11-08 DIAGNOSIS — F329 Major depressive disorder, single episode, unspecified: Secondary | ICD-10-CM

## 2018-11-08 DIAGNOSIS — E1169 Type 2 diabetes mellitus with other specified complication: Secondary | ICD-10-CM

## 2018-11-08 DIAGNOSIS — Z0000 Encounter for general adult medical examination without abnormal findings: Secondary | ICD-10-CM

## 2018-11-08 DIAGNOSIS — L858 Other specified epidermal thickening: Secondary | ICD-10-CM

## 2018-11-08 DIAGNOSIS — E119 Type 2 diabetes mellitus without complications: Secondary | ICD-10-CM

## 2018-11-08 DIAGNOSIS — F32A Depression, unspecified: Secondary | ICD-10-CM

## 2018-11-08 DIAGNOSIS — I152 Hypertension secondary to endocrine disorders: Secondary | ICD-10-CM

## 2018-11-08 DIAGNOSIS — E1159 Type 2 diabetes mellitus with other circulatory complications: Secondary | ICD-10-CM

## 2018-11-08 MED ORDER — BUPROPION HCL ER (XL) 150 MG PO TB24: ORAL_TABLET | ORAL | 3 refills | Status: DC

## 2018-11-08 MED ORDER — ESCITALOPRAM OXALATE 20 MG PO TABS: ORAL_TABLET | ORAL | 3 refills | Status: DC

## 2018-11-08 NOTE — Progress Notes (Signed)
BP 128/83   Pulse 81   Temp (!) 96.8 F (36 C) (Oral)   Ht _0  (1.778 m)   Wt 216 lb 9.6 oz (98.2 kg)   BMI 31.08 kg/m    Subjective:    Patient ID: Sean Price, male    DOB: Jun 15, 1956, 63 y.o.   MRN: [id number]  HPI: FEDRICK Price is a 63 y.o. male presenting on 11/08/2018 for Annual Exam   HPI  Well adult exam and physical and recheck of chronic issues  Type 2 diabetes mellitus Patient comes in today for recheck of his diabetes. Patient has been currently taking no medication currently and we are monitoring for now but we will recheck and see where he is at, last time it was 7.1.. Patient is currently on an ACE inhibitor/ARB. Patient has not seen an ophthalmologist this year. Patient denies any issues with their feet.   Hypertension Patient is currently on lisinopril and carvedilol, and their blood pressure today is 128/83. Patient denies any lightheadedness or dizziness. Patient denies headaches, blurred vision, chest pains, shortness of breath, or weakness. Denies any side effects from medication and is content with current medication.   Hyperlipidemia Patient is coming in for recheck of his hyperlipidemia. The patient is currently taking Lipitor. They deny any issues with myalgias or history of liver damage from it. They deny any focal numbness or weakness or chest pain.   Patient is coming in for recheck of his depression today.  He says that he is doing very well on his medications very happy and content with where he is at.  He denies any major issues with that and would like to continue on it for now.  Patient has some skin tags on his face and a couple skin spots on his neck that he would like checked out and she will can do about them.  Relevant past medical, surgical, family and social history reviewed and updated as indicated. Interim medical history since our last visit reviewed. Allergies and medications reviewed and updated.  Review of Systems    Constitutional: Negative for chills and fever.  Eyes: Negative for visual disturbance.  Respiratory: Negative for shortness of breath and wheezing.   Cardiovascular: Negative for chest pain and leg swelling.  Musculoskeletal: Negative for back pain and gait problem.  Skin: Negative for rash.  Neurological: Negative for dizziness, weakness, light-headedness and numbness.  All other systems reviewed and are negative.   Per HPI unless specifically indicated above   Allergies as of 11/08/2018   No Known Allergies     Medication List       Accurate as of November 08, 2018  2:53 PM. Always use your most recent med list.        atorvastatin 40 MG tablet Commonly known as:  LIPITOR TAKE ONE TABLET BY MOUTH AT BEDTIME -  NEEDS  OFFICE  VISIT  &  LABS   buPROPion 150 MG 24 hr tablet Commonly known as:  WELLBUTRIN XL TAKE 1 TABLET BY MOUTH ONCE DAILY   carvedilol 6.25 MG tablet Commonly known as:  COREG Take 1 tablet (6.25 mg total) by mouth 2 (two) times daily with a meal.   escitalopram 20 MG tablet Commonly known as:  LEXAPRO TAKE 1 TABLET BY MOUTH ONCE DAILY   Fish Oil 1200 MG Caps Take 1,200 mg by mouth daily.   glucose blood test strip One touch Ultra strips #100/11 refills, lancets #1 box, Checks BS bid and DX:250.00  lisinopril 20 MG tablet Commonly known as:  PRINIVIL,ZESTRIL Take 1 tablet (20 mg total) by mouth daily.   ONE TOUCH ULTRA SYSTEM KIT w/Device Kit 1 kit by Does not apply route once.   pantoprazole 40 MG tablet Commonly known as:  PROTONIX Take 1 tablet (40 mg total) by mouth daily. For stomach          Objective:    BP 128/83   Pulse 81   Temp (!) 96.8 F (36 C) (Oral)   Ht _0  (1.778 m)   Wt 216 lb 9.6 oz (98.2 kg)   BMI 31.08 kg/m   Wt Readings from Last 3 Encounters:  11/08/18 216 lb 9.6 oz (98.2 kg)  09/12/18 211 lb (95.7 kg)  04/02/18 217 lb (98.4 kg)    Physical Exam Vitals signs and nursing note reviewed.   Constitutional:      General: He is not in acute distress.    Appearance: He is well-developed. He is not diaphoretic.  Eyes:     General: No scleral icterus.    Conjunctiva/sclera: Conjunctivae normal.  Neck:     Musculoskeletal: Neck supple.     Thyroid: No thyromegaly.  Cardiovascular:     Rate and Rhythm: Normal rate and regular rhythm.     Heart sounds: Normal heart sounds. No murmur.  Pulmonary:     Effort: Pulmonary effort is normal. No respiratory distress.     Breath sounds: Normal breath sounds. No wheezing.  Musculoskeletal: Normal range of motion.  Lymphadenopathy:     Cervical: No cervical adenopathy.  Skin:    General: Skin is warm and dry.     Findings: Lesion (cutaneous horn on right neck) present. No rash.  Neurological:     Mental Status: He is alert and oriented to person, place, and time.     Coordination: Coordination normal.  Psychiatric:        Behavior: Behavior normal.    Results for orders placed or performed in visit on 10/10/18  Bayer DCA Hb A1c Waived  Result Value Ref Range   HB A1C (BAYER DCA - WAIVED) 7.1 (H) <7.0 %  CMP14+EGFR  Result Value Ref Range   Glucose 163 (H) 65 - 99 mg/dL   BUN 26 8 - 27 mg/dL   Creatinine, Ser 1.00 0.76 - 1.27 mg/dL   GFR calc non Af Amer 81 >59 mL/min/1.73   GFR calc Af Amer 93 >59 mL/min/1.73   BUN/Creatinine Ratio 26 (H) 10 - 24   Sodium 139 134 - 144 mmol/L   Potassium 4.8 3.5 - 5.2 mmol/L   Chloride 103 96 - 106 mmol/L   CO2 21 20 - 29 mmol/L   Calcium 9.4 8.6 - 10.2 mg/dL   Total Protein 6.4 6.0 - 8.5 g/dL   Albumin 4.4 3.6 - 4.8 g/dL   Globulin, Total 2.0 1.5 - 4.5 g/dL   Albumin/Globulin Ratio 2.2 1.2 - 2.2   Bilirubin Total <0.2 0.0 - 1.2 mg/dL   Alkaline Phosphatase 86 39 - 117 IU/L   AST 14 0 - 40 IU/L   ALT 21 0 - 44 IU/L      Assessment & Plan:   Problem List Items Addressed This Visit      Cardiovascular and Mediastinum   Hypertension associated with diabetes ([place])     Endocrine    Type 2 diabetes mellitus without complications ([place])   Hyperlipidemia associated with type 2 diabetes mellitus ([place])     Other   Depression,  major, single episode, mild (HCC)   Relevant Medications   escitalopram (LEXAPRO) 20 MG tablet   buPROPion (WELLBUTRIN XL) 150 MG 24 hr tablet    Other Visit Diagnoses    Well adult exam    -  Primary   Depression, unspecified depression type       Relevant Medications   escitalopram (LEXAPRO) 20 MG tablet   buPROPion (WELLBUTRIN XL) 150 MG 24 hr tablet   Anxiety       Relevant Medications   escitalopram (LEXAPRO) 20 MG tablet   buPROPion (WELLBUTRIN XL) 150 MG 24 hr tablet   Cutaneous horn       Relevant Orders   Ambulatory referral to Dermatology      From Wellbutrin and other medications for now. Follow up plan: Return in about 3 months (around 02/07/2019), or if symptoms worsen or fail to improve, for Type 2 diabetes.  Counseling provided for all of the vaccine components Orders Placed This Encounter  Procedures  . Ambulatory referral to Dermatology    Caryl Pina, MD [facility] 11/08/2018, 2:53 PM

## 2018-11-22 DIAGNOSIS — D485 Neoplasm of uncertain behavior of skin: Secondary | ICD-10-CM | POA: Diagnosis not present

## 2018-11-22 DIAGNOSIS — D2239 Melanocytic nevi of other parts of face: Secondary | ICD-10-CM | POA: Diagnosis not present

## 2018-11-22 DIAGNOSIS — L919 Hypertrophic disorder of the skin, unspecified: Secondary | ICD-10-CM | POA: Diagnosis not present

## 2018-11-22 DIAGNOSIS — D1801 Hemangioma of skin and subcutaneous tissue: Secondary | ICD-10-CM | POA: Diagnosis not present

## 2018-11-22 DIAGNOSIS — L820 Inflamed seborrheic keratosis: Secondary | ICD-10-CM | POA: Diagnosis not present

## 2019-01-17 ENCOUNTER — Other Ambulatory Visit: Payer: Self-pay | Admitting: Family Medicine

## 2019-01-17 ENCOUNTER — Other Ambulatory Visit: Payer: Self-pay

## 2019-01-17 DIAGNOSIS — I10 Essential (primary) hypertension: Secondary | ICD-10-CM

## 2019-01-17 DIAGNOSIS — F320 Major depressive disorder, single episode, mild: Secondary | ICD-10-CM

## 2019-01-17 DIAGNOSIS — F329 Major depressive disorder, single episode, unspecified: Secondary | ICD-10-CM

## 2019-01-17 DIAGNOSIS — F32A Depression, unspecified: Secondary | ICD-10-CM

## 2019-01-17 DIAGNOSIS — F419 Anxiety disorder, unspecified: Secondary | ICD-10-CM

## 2019-01-17 MED ORDER — PANTOPRAZOLE SODIUM 40 MG PO TBEC: 40.0000 mg | DELAYED_RELEASE_TABLET | Freq: Every day | ORAL | 0 refills | Status: DC

## 2019-01-17 MED ORDER — ATORVASTATIN CALCIUM 40 MG PO TABS: ORAL_TABLET | ORAL | 0 refills | Status: DC

## 2019-01-17 MED ORDER — CARVEDILOL 6.25 MG PO TABS: 6.2500 mg | ORAL_TABLET | Freq: Two times a day (BID) | ORAL | 0 refills | Status: DC

## 2019-01-17 MED ORDER — ESCITALOPRAM OXALATE 20 MG PO TABS: ORAL_TABLET | ORAL | 0 refills | Status: DC

## 2019-01-17 MED ORDER — BUPROPION HCL ER (XL) 150 MG PO TB24: ORAL_TABLET | ORAL | 0 refills | Status: DC

## 2019-01-17 NOTE — Telephone Encounter (Signed)
3 month supply sent in for patient due to [place]

## 2019-01-18 ENCOUNTER — Ambulatory Visit: Payer: BLUE CROSS/BLUE SHIELD | Admitting: Family Medicine

## 2019-02-11 ENCOUNTER — Ambulatory Visit: Payer: BLUE CROSS/BLUE SHIELD | Admitting: Family Medicine

## 2019-03-15 ENCOUNTER — Telehealth: Payer: Self-pay | Admitting: Family Medicine

## 2019-03-28 ENCOUNTER — Ambulatory Visit (INDEPENDENT_AMBULATORY_CARE_PROVIDER_SITE_OTHER): Payer: BLUE CROSS/BLUE SHIELD | Admitting: Family Medicine

## 2019-03-28 ENCOUNTER — Encounter: Payer: Self-pay | Admitting: Family Medicine

## 2019-03-28 ENCOUNTER — Other Ambulatory Visit: Payer: Self-pay

## 2019-03-28 VITALS — BP 151/89 | HR 75 | Temp 97.3°F | Ht 70.0 in | Wt 216.4 lb

## 2019-03-28 DIAGNOSIS — F320 Major depressive disorder, single episode, mild: Secondary | ICD-10-CM | POA: Diagnosis not present

## 2019-03-28 DIAGNOSIS — I152 Hypertension secondary to endocrine disorders: Secondary | ICD-10-CM

## 2019-03-28 DIAGNOSIS — F419 Anxiety disorder, unspecified: Secondary | ICD-10-CM | POA: Diagnosis not present

## 2019-03-28 DIAGNOSIS — I10 Essential (primary) hypertension: Secondary | ICD-10-CM

## 2019-03-28 DIAGNOSIS — E1159 Type 2 diabetes mellitus with other circulatory complications: Secondary | ICD-10-CM | POA: Diagnosis not present

## 2019-03-28 DIAGNOSIS — E785 Hyperlipidemia, unspecified: Secondary | ICD-10-CM

## 2019-03-28 DIAGNOSIS — E119 Type 2 diabetes mellitus without complications: Secondary | ICD-10-CM | POA: Diagnosis not present

## 2019-03-28 DIAGNOSIS — E1169 Type 2 diabetes mellitus with other specified complication: Secondary | ICD-10-CM

## 2019-03-28 DIAGNOSIS — F329 Major depressive disorder, single episode, unspecified: Secondary | ICD-10-CM | POA: Diagnosis not present

## 2019-03-28 DIAGNOSIS — F32A Depression, unspecified: Secondary | ICD-10-CM

## 2019-03-28 LAB — LIPID PANEL

## 2019-03-28 LAB — BAYER DCA HB A1C WAIVED: HB A1C (BAYER DCA - WAIVED): 7.1 % — ABNORMAL HIGH (ref <7.0)

## 2019-03-28 MED ORDER — BUPROPION HCL ER (XL) 150 MG PO TB24: ORAL_TABLET | ORAL | 1 refills | Status: DC

## 2019-03-28 MED ORDER — LISINOPRIL 20 MG PO TABS: 20.0000 mg | ORAL_TABLET | Freq: Every day | ORAL | 3 refills | Status: DC

## 2019-03-28 MED ORDER — ESCITALOPRAM OXALATE 20 MG PO TABS: ORAL_TABLET | ORAL | 1 refills | Status: DC

## 2019-03-28 NOTE — Progress Notes (Signed)
BP (!) 151/89   Pulse 75   Temp (!) 97.3 F (36.3 C) (Oral)   Ht _0  (1.778 m)   Wt 216 lb 6.4 oz (98.2 kg)   BMI 31.05 kg/m    Subjective:   Patient ID: Sean Price, male    DOB: 1956-05-20, 63 y.o.   MRN: [id number]  HPI: Sean Price is a 63 y.o. male presenting on 03/28/2019 for Diabetes (3 month follow up) and Depression   HPI Type 2 diabetes mellitus Patient comes in today for recheck of his diabetes. Patient has been currently taking diet control, he says he has been running in the 110s to 130s and is feeling well and has been doing diet and exercise and has even lost a few pounds per him.. Patient is currently on an ACE inhibitor/ARB. Patient has not seen an ophthalmologist this year. Patient denies any issues with their feet.   Hypertension Patient is currently on lisinopril 20, and their blood pressure today is 151/89 but he says at home it is running in the 125 over high 80s. Patient denies any lightheadedness or dizziness. Patient denies headaches, blurred vision, chest pains, shortness of breath, or weakness. Denies any side effects from medication and is content with current medication.   Hyperlipidemia Patient is coming in for recheck of his hyperlipidemia. The patient is currently taking no medication because he stopped both Lipitor and omega-3's. They deny any issues with myalgias or history of liver damage from it. They deny any focal numbness or weakness or chest pain.   Depression and anxiety recheck. Patient says his depression has been up and he admits that is been causing him to feel down a little bit more stressed and anxious especially with the coronavirus stuff.  He denies any suicidal ideations or thoughts of hurting himself.  He says he feels normal stressed and anxious and does not feel like it is overwhelming.  He feels like the Lexapro and Wellbutrin are still helping him and he is content with where they are at and wants to stay the same.  Relevant past medical, surgical, family and social history reviewed and updated as indicated. Interim medical history since our last visit reviewed. Allergies and medications reviewed and updated.  Review of Systems  Constitutional: Negative for chills and fever.  Eyes: Negative for visual disturbance.  Respiratory: Negative for shortness of breath and wheezing.   Cardiovascular: Negative for chest pain and leg swelling.  Musculoskeletal: Negative for back pain and gait problem.  Skin: Positive for wound. Negative for color change and rash.  Neurological: Negative for dizziness, weakness and numbness.  All other systems reviewed and are negative.   Per HPI unless specifically indicated above   Allergies as of 03/28/2019   No Known Allergies     Medication List       Accurate as of Mar 28, 2019  8:36 AM. If you have any questions, ask your nurse or doctor.        STOP taking these medications   atorvastatin 40 MG tablet Commonly known as:  LIPITOR Stopped by:  Fransisca Kaufmann Shamiya Demeritt, MD   carvedilol 6.25 MG tablet Commonly known as:  COREG Stopped by:  Worthy Rancher, MD   Fish Oil 1200 MG Caps Stopped by:  Fransisca Kaufmann Arriah Wadle, MD   pantoprazole 40 MG tablet Commonly known as:  PROTONIX Stopped by:  Worthy Rancher, MD     TAKE these medications   buPROPion 150 MG 24  hr tablet Commonly known as:  WELLBUTRIN XL TAKE 1 TABLET BY MOUTH ONCE DAILY   escitalopram 20 MG tablet Commonly known as:  LEXAPRO TAKE 1 TABLET BY MOUTH ONCE DAILY   glucose blood test strip One touch Ultra strips #100/11 refills, lancets #1 box, Checks BS bid and DX:250.00   lisinopril 20 MG tablet Commonly known as:  ZESTRIL Take 1 tablet (20 mg total) by mouth daily.   ONE TOUCH ULTRA SYSTEM KIT w/Device Kit 1 kit by Does not apply route once.        Objective:   BP (!) 151/89   Pulse 75   Temp (!) 97.3 F (36.3 C) (Oral)   Ht _0  (1.778 m)   Wt 216 lb 6.4 oz (98.2 kg)    BMI 31.05 kg/m   Wt Readings from Last 3 Encounters:  03/28/19 216 lb 6.4 oz (98.2 kg)  11/08/18 216 lb 9.6 oz (98.2 kg)  09/12/18 211 lb (95.7 kg)    Physical Exam Vitals signs and nursing note reviewed.  Constitutional:      General: He is not in acute distress.    Appearance: He is well-developed. He is not diaphoretic.  Eyes:     General: No scleral icterus.    Conjunctiva/sclera: Conjunctivae normal.  Neck:     Musculoskeletal: Neck supple.     Thyroid: No thyromegaly.  Cardiovascular:     Rate and Rhythm: Normal rate and regular rhythm.     Heart sounds: Normal heart sounds. No murmur.  Pulmonary:     Effort: Pulmonary effort is normal. No respiratory distress.     Breath sounds: Normal breath sounds. No wheezing.  Musculoskeletal: Normal range of motion.  Lymphadenopathy:     Cervical: No cervical adenopathy.  Skin:    General: Skin is warm and dry.     Findings: No rash.  Neurological:     Mental Status: He is alert and oriented to person, place, and time.     Coordination: Coordination normal.  Psychiatric:        Behavior: Behavior normal.    Diabetic Foot Exam - Simple   Simple Foot Form Diabetic Foot exam was performed with the following findings:  Yes 03/28/2019  8:33 AM  Visual Inspection See comments:  Yes Sensation Testing Intact to touch and monofilament testing bilaterally:  Yes Pulse Check Posterior Tibialis and Dorsalis pulse intact bilaterally:  Yes Comments Patient does have one small spot that appears to be healing on his right foot just proximal to his fifth toe, no erythema is noted and he does have good sensation and it is slightly tender to touch, recommended moisturization and close monitoring       Assessment & Plan:   Problem List Items Addressed This Visit      Cardiovascular and Mediastinum   Hypertension associated with diabetes ([place])   Relevant Medications   lisinopril (ZESTRIL) 20 MG tablet   Other Relevant Orders    CMP14+EGFR     Endocrine   Type 2 diabetes mellitus without complications ([place]) - Primary   Relevant Medications   lisinopril (ZESTRIL) 20 MG tablet   Other Relevant Orders   CMP14+EGFR   Bayer DCA Hb A1c Waived   Hyperlipidemia associated with type 2 diabetes mellitus ([place])   Relevant Medications   lisinopril (ZESTRIL) 20 MG tablet   Other Relevant Orders   Lipid panel     Other   Depression, major, single episode, mild (HCC)   Relevant Medications  escitalopram (LEXAPRO) 20 MG tablet   buPROPion (WELLBUTRIN XL) 150 MG 24 hr tablet   Other Relevant Orders   CBC with Differential/Platelet    Other Visit Diagnoses    Depression, unspecified depression type       Relevant Medications   escitalopram (LEXAPRO) 20 MG tablet   buPROPion (WELLBUTRIN XL) 150 MG 24 hr tablet   Other Relevant Orders   CBC with Differential/Platelet   Anxiety       Relevant Medications   escitalopram (LEXAPRO) 20 MG tablet   buPROPion (WELLBUTRIN XL) 150 MG 24 hr tablet   Other Relevant Orders   CBC with Differential/Platelet    Recommended moisturization for the spot on his foot and athlete's foot treatment, continue monitor closely for any signs of infection and return if any signs appear.  Continue current medication including Lexapro and Wellbutrin and lisinopril, may need to be back on Lipitor and fish oils but will check blood work today  Follow up plan: Return in about 3 months (around 06/28/2019), or if symptoms worsen or fail to improve, for Depression and diabetes recheck.  Counseling provided for all of the vaccine components Orders Placed This Encounter  Procedures  . CBC with Differential/Platelet  . CMP14+EGFR  . Lipid panel  . Bayer [facility] Hb A1c Th[place], MD [facility] 03/28/2019, 8:36 AM

## 2019-03-29 LAB — LIPID PANEL
Chol/HDL Ratio: 4.4 ratio (ref 0.0–5.0)
Cholesterol, Total: 193 mg/dL (ref 100–199)
HDL: 44 mg/dL (ref >39)
LDL Calculated: 128 mg/dL — ABNORMAL HIGH (ref 0–99)
Triglycerides: 107 mg/dL (ref 0–149)
VLDL Cholesterol Cal: 21 mg/dL (ref 5–40)

## 2019-03-29 LAB — CMP14+EGFR
ALT: 21 IU/L (ref 0–44)
AST: 13 IU/L (ref 0–40)
Albumin/Globulin Ratio: 1.9 (ref 1.2–2.2)
Albumin: 4.3 g/dL (ref 3.8–4.8)
Alkaline Phosphatase: 86 IU/L (ref 39–117)
BUN/Creatinine Ratio: 21 (ref 10–24)
BUN: 21 mg/dL (ref 8–27)
Bilirubin Total: <0.2 mg/dL (ref 0.0–1.2)
CO2: 21 mmol/L (ref 20–29)
Calcium: 9.3 mg/dL (ref 8.6–10.2)
Chloride: 103 mmol/L (ref 96–106)
Creatinine, Ser: 1.01 mg/dL (ref 0.76–1.27)
GFR calc Af Amer: 92 mL/min/{1.73_m2} (ref >59)
GFR calc non Af Amer: 79 mL/min/{1.73_m2} (ref >59)
Globulin, Total: 2.3 g/dL (ref 1.5–4.5)
Glucose: 160 mg/dL — ABNORMAL HIGH (ref 65–99)
Potassium: 4.6 mmol/L (ref 3.5–5.2)
Sodium: 140 mmol/L (ref 134–144)
Total Protein: 6.6 g/dL (ref 6.0–8.5)

## 2019-03-29 LAB — CBC WITH DIFFERENTIAL/PLATELET
Basophils Absolute: 0.1 10*3/uL (ref 0.0–0.2)
Basos: 1 %
EOS (ABSOLUTE): 0.2 10*3/uL (ref 0.0–0.4)
Eos: 3 %
Hematocrit: 44.7 % (ref 37.5–51.0)
Hemoglobin: 14.7 g/dL (ref 13.0–17.7)
Immature Grans (Abs): 0 10*3/uL (ref 0.0–0.1)
Immature Granulocytes: 1 %
Lymphocytes Absolute: 1.7 10*3/uL (ref 0.7–3.1)
Lymphs: 28 %
MCH: 29.5 pg (ref 26.6–33.0)
MCHC: 32.9 g/dL (ref 31.5–35.7)
MCV: 90 fL (ref 79–97)
Monocytes Absolute: 0.7 10*3/uL (ref 0.1–0.9)
Monocytes: 11 %
Neutrophils Absolute: 3.6 10*3/uL (ref 1.4–7.0)
Neutrophils: 56 %
Platelets: 265 10*3/uL (ref 150–450)
RBC: 4.98 x10E6/uL (ref 4.14–5.80)
RDW: 12.8 % (ref 11.6–15.4)
WBC: 6.3 10*3/uL (ref 3.4–10.8)

## 2019-04-08 ENCOUNTER — Telehealth: Payer: Self-pay | Admitting: Family Medicine

## 2019-04-08 MED ORDER — ATORVASTATIN CALCIUM 40 MG PO TABS: 40.0000 mg | ORAL_TABLET | Freq: Every day | ORAL | 3 refills | Status: DC

## 2019-04-08 NOTE — Telephone Encounter (Signed)
rtn call about labs

## 2019-04-08 NOTE — Telephone Encounter (Signed)
Aware of results and lipitor sent to pharmacy

## 2019-04-22 ENCOUNTER — Other Ambulatory Visit: Payer: Self-pay

## 2019-04-22 ENCOUNTER — Ambulatory Visit (INDEPENDENT_AMBULATORY_CARE_PROVIDER_SITE_OTHER): Payer: BC Managed Care – PPO

## 2019-04-22 ENCOUNTER — Ambulatory Visit (INDEPENDENT_AMBULATORY_CARE_PROVIDER_SITE_OTHER): Payer: BC Managed Care – PPO | Admitting: Family

## 2019-04-22 ENCOUNTER — Encounter: Payer: Self-pay | Admitting: Family

## 2019-04-22 VITALS — BP 149/103 | HR 78 | Temp 97.9°F | Wt 218.4 lb

## 2019-04-22 DIAGNOSIS — M25562 Pain in left knee: Secondary | ICD-10-CM

## 2019-04-22 DIAGNOSIS — M25462 Effusion, left knee: Secondary | ICD-10-CM | POA: Diagnosis not present

## 2019-04-22 MED ORDER — DICLOFENAC SODIUM 75 MG PO TBEC: 75.0000 mg | DELAYED_RELEASE_TABLET | Freq: Two times a day (BID) | ORAL | 0 refills | Status: DC

## 2019-04-22 NOTE — Progress Notes (Signed)
Subjective:    Patient ID: Sean Price, male    DOB: [birth year], 63 y.o.   MRN: [id number]  Chief Complaint  Patient presents with  . left knee pain   Pt presents to the office today with left knee pain that started two weeks ago after twisting it while kayaking.  Knee Pain  The incident occurred more than 1 week ago. The injury mechanism was a twisting injury. The pain is present in the left knee. The pain is at a severity of 9/10. The pain is moderate. The pain has been constant since onset. Pertinent negatives include no numbness or tingling. He reports no foreign bodies present. The symptoms are aggravated by weight bearing and movement. He has tried non-weight bearing and NSAIDs for the symptoms. The treatment provided mild relief.      Review of Systems  Neurological: Negative for tingling and numbness.  All other systems reviewed and are negative.      Objective:   Physical Exam Vitals signs reviewed.  Constitutional:      General: He is not in acute distress.    Appearance: He is well-developed.  HENT:     Head: Normocephalic.     Right Ear: Tympanic membrane normal.     Left Ear: Tympanic membrane normal.  Eyes:     General:        Right eye: No discharge.        Left eye: No discharge.     Pupils: Pupils are equal, round, and reactive to light.  Neck:     Musculoskeletal: Normal range of motion and neck supple.     Thyroid: No thyromegaly.  Cardiovascular:     Rate and Rhythm: Normal rate and regular rhythm.     Heart sounds: Normal heart sounds. No murmur.  Pulmonary:     Effort: Pulmonary effort is normal. No respiratory distress.     Breath sounds: Normal breath sounds. No wheezing.  Abdominal:     General: Bowel sounds are normal. There is no distension.     Palpations: Abdomen is soft.     Tenderness: There is no abdominal tenderness.  Musculoskeletal: Normal range of motion.        General: Tenderness present.     Left lower leg: Edema (2+  in left knee) present.     Comments: Pain in left knee with flexion and extension  Skin:    General: Skin is warm and dry.     Findings: No erythema or rash.  Neurological:     Mental Status: He is alert and oriented to person, place, and time.     Cranial Nerves: No cranial nerve deficit.     Deep Tendon Reflexes: Reflexes are normal and symmetric.  Psychiatric:        Behavior: Behavior normal.        Thought Content: Thought content normal.        Judgment: Judgment normal.    X-ray- Degenerative  changes noted, Preliminary reading by Evelina Dun, FNP [facility]    BP (!) 149/103   Pulse 78   Temp 97.9 F (36.6 C) (Oral)   Wt 218 lb 6.4 oz (99.1 kg)   BMI 31.34 kg/m      Assessment & Plan:  [name] comes in today with chief complaint of left knee pain   Diagnosis and orders addressed:  1. Acute pain of left knee Rest Ice ROM exercises encouraged Referral to Ortho pending - DG Knee 1-2  Views Left; Future - Ambulatory referral to Orthopedic Surgery - diclofenac (VOLTAREN) 75 MG EC tablet; Take 1 tablet (75 mg total) by mouth 2 (two) times daily.  Dispense: 30 tablet; Refill: 0  Evelina Dun, FNP

## 2019-04-22 NOTE — Patient Instructions (Signed)
Acute Knee Pain, Adult  Acute knee pain is sudden and may be caused by damage, swelling, or irritation of the muscles and tissues that support your knee. The injury may result from:   A fall.   An injury to your knee from twisting motions.   A hit to the knee.   Infection.  Acute knee pain may go away on its own with time and rest. If it does not, your health care provider may order tests to find the cause of the pain. These may include:   Imaging tests, such as an X-ray, MRI, or ultrasound.   Joint aspiration. In this test, fluid is removed from the knee.   Arthroscopy. In this test, a lighted tube is inserted into the knee and an image is projected onto a TV screen.   Biopsy. In this test, a sample of tissue is removed from the body and studied under a microscope.  Follow these instructions at home:  Pay attention to any changes in your symptoms. Take these actions to relieve your pain.  If you have a knee sleeve or brace:     Wear the sleeve or brace as told by your health care provider. Remove it only as told by your health care provider.   Loosen the sleeve or brace if your toes tingle, become numb, or turn cold and blue.   Keep the sleeve or brace clean.   If the sleeve or brace is not waterproof:  ? Do not let it get wet.  ? Cover it with a watertight covering when you take a bath or shower.  Activity   Rest your knee.   Do not do things that cause pain or make pain worse.   Avoid high-impact activities or exercises, such as running, jumping rope, or doing jumping jacks.   Work with a physical therapist to make a safe exercise program, as recommended by your health care provider. Do exercises as told by your physical therapist.  Managing pain, stiffness, and swelling     If directed, put ice on the knee:  ? Put ice in a plastic bag.  ? Place a towel between your skin and the bag.  ? Leave the ice on for 20 minutes, 2-3 times a day.   If directed, use an elastic bandage to put pressure  (compression) on your injured knee. This may control swelling, give support, and help with discomfort.  General instructions   Take over-the-counter and prescription medicines only as told by your health care provider.   Raise (elevate) your knee above the level of your heart when you are sitting or lying down.   Sleep with a pillow under your knee.   Do not use any products that contain nicotine or tobacco, such as cigarettes, e-cigarettes, and chewing tobacco. These can delay healing. If you need help quitting, ask your health care provider.   If you are overweight, work with your health care provider and a dietitian to set a weight-loss goal that is healthy and reasonable for you. Extra weight can put pressure on your knee.   Keep all follow-up visits as told by your health care provider. This is important.  Contact a health care provider if:   Your knee pain continues, changes, or gets worse.   You have a fever along with knee pain.   Your knee feels warm to the touch.   Your knee buckles or locks up.  Get help right away if:   Your knee swells,   and the swelling becomes worse.   You cannot move your knee.   You have severe pain in your knee.  Summary   Acute knee pain can be caused by a fall, an injury, an infection, or damage, swelling, or irritation of the tissues that support your knee.   Your health care provider may perform tests to find out the cause of the pain.   Pay attention to any changes in your symptoms. Relieve your pain with rest, medicines, light activity, and use of ice.   Get help if your pain continues or becomes worse, your knee swells, or you cannot move your knee.  This information is not intended to replace advice given to you by your health care provider. Make sure you discuss any questions you have with your health care provider.  Document Released: 08/14/2007 Document Revised: 03/29/2018 Document Reviewed: 03/29/2018  [employer]vier Interactive Patient Education  2019  [employer]

## 2019-07-01 ENCOUNTER — Ambulatory Visit: Payer: BLUE CROSS/BLUE SHIELD | Admitting: Family Medicine

## 2019-07-10 ENCOUNTER — Encounter: Payer: Self-pay | Admitting: Family Medicine

## 2019-08-01 ENCOUNTER — Encounter: Payer: Self-pay | Admitting: Adult Health

## 2019-08-01 ENCOUNTER — Ambulatory Visit (INDEPENDENT_AMBULATORY_CARE_PROVIDER_SITE_OTHER): Payer: BC Managed Care – PPO | Admitting: Adult Health

## 2019-08-01 ENCOUNTER — Other Ambulatory Visit: Payer: Self-pay

## 2019-08-01 VITALS — Ht 70.0 in | Wt 215.0 lb

## 2019-08-01 DIAGNOSIS — F411 Generalized anxiety disorder: Secondary | ICD-10-CM

## 2019-08-01 DIAGNOSIS — G4700 Insomnia, unspecified: Secondary | ICD-10-CM

## 2019-08-01 DIAGNOSIS — F331 Major depressive disorder, recurrent, moderate: Secondary | ICD-10-CM

## 2019-08-01 MED ORDER — BUPROPION HCL ER (XL) 150 MG PO TB24: ORAL_TABLET | ORAL | 1 refills | Status: DC

## 2019-08-01 NOTE — Progress Notes (Signed)
[facility] MD/PA/NP Initial Note  08/01/2019 8:27 AM Sean Price  MRN:  [id number]  Chief Complaint:  Chief Complaint    Anxiety; Depression; Insomnia     HPI:   Describes mood today as "so-so". Pleasant. Mood symptoms - reports depression, anxiety, and irritability. Feels more "depressed". Gets very "moody: at times. Symptoms have increased over past several months. Does his "best" to be courteous to everyone. Doesn't like feeling he wants to "jump someone's case". Trying not to watch the news - "very political". Having increased issues with "focusing" on things. Doesn't feel as positive as he once was. Wife has noticed a change in him. Has times where he "checks out". Stable interest and motivation. Taking medications as prescribed.  Energy levels stable. Active, does not have a regular exercise routine. Works full-time - 40 hours - [employer].  Enjoys some usual interests and activities. Married - lives with wife of 48 years. Has 1 daughter and 2 step-daughters. At odds due to "political" issues. Spending time with family. Does a lot of camping and motorsports. Works on house - bought an old old house to restore a few years ago  Appetite adequate. Weight stable. Sleeps well most nights. Averages 6 to 7 hours. Waking up during the night worrying and then getting back to sleep.  Focus and concentration difficulties. Completing tasks. Managing aspects of household. Doing job for over 40 years and sometimes looks at computer and feels "baffled". Having issues sometimes - more so this week. Denies SI or HI. Denies AH or VH.  Visit Diagnosis:    ICD-10-CM   1. Insomnia, unspecified type  G47.00   2. Major depressive disorder, recurrent episode, moderate (HCC)  F33.1   3. Generalized anxiety disorder  F41.1    Past Psychiatric History: Denies  Past Medical History:  Past Medical History:  Diagnosis Date  . ADD (attention deficit disorder)   . Anxiety   . Atrial fibrillation with RVR  ([place])    new found on 12/18/10 visit  . Bipolar disorder ([place]) 01/2014  . BPH with elevated PSA   . Depression   . Diabetes mellitus without complication ([place])   . GERD (gastroesophageal reflux disease)   . Hyperlipidemia   . Hypertension   . Myocardial infarction ([place])   . Sinus arrhythmia     Past Surgical History:  Procedure Laterality Date  . bilateral inguinal hernia    . CARPAL TUNNEL RELEASE  1999   left hand  . CERVICAL FUSION  2007   c5-c6  . COLONOSCOPY    . compound fracture  1999, 2001, 2001   right leg, insertion of hardware in 2001, removal of hardware 2001  . FRACTURE SURGERY    . LEG SURGERY Right    hardware removal  . PROSTATE BIOPSY     TRUS/BX  BPH only  . TONSILLECTOMY  1970   Family Psychiatric History: Denies  Family History:  Family History  Problem Relation Age of Onset  . Colon cancer Brother 12  . Breast cancer Sister        mid 17's  . Diabetes Mother   . Heart attack Father 87       massive /died  . Other Father        blood infection   Social History:  Social History   Socioeconomic History  . Marital status: Married    Spouse name: Suanne Marker  . Number of children: 3  . Years of education: Not on file  . Highest  education level: Not on file  Occupational History  . Occupation: SCHEDULING/planner    Employer: [employer]  Social Needs  . Financial resource strain: Not on file  . Food insecurity    Worry: Not on file    Inability: Not on file  . Transportation needs    Medical: Not on file    Non-medical: Not on file  Tobacco Use  . Smoking status: Former Smoker    Years: 4.00    Types: Cigarettes    Quit date: 05/29/1984    Years since quitting: 35.1  . Smokeless tobacco: Never Used  Substance and Sexual Activity  . Alcohol use: Yes    Alcohol/week: 1.0 standard drinks    Types: 1 Standard drinks or equivalent per week    Comment: social  . Drug use: No  . Sexual activity: Yes  Lifestyle  . Physical  activity    Days per week: Not on file    Minutes per session: Not on file  . Stress: Not on file  Relationships  . Social Herbalist on phone: Not on file    Gets together: Not on file    Attends religious service: Not on file    Active member of club or organization: Not on file    Attends meetings of clubs or organizations: Not on file    Relationship status: Not on file  Other Topics Concern  . Not on file  Social History Narrative  . Not on file   Allergies: No Known Allergies  Metabolic Disorder Labs: Lab Results  Component Value Date   HGBA1C 7.1 (H) 03/28/2019   MPG 128 (H) 03/07/2013   MPG 140 (H) 12/18/2010   No results found for: PROLACTIN Lab Results  Component Value Date   CHOL 193 03/28/2019   TRIG 107 03/28/2019   HDL 44 03/28/2019   CHOLHDL 4.4 03/28/2019   VLDL 21 03/07/2013   LDLCALC 128 (H) 03/28/2019   [place] 80 07/13/2017   Lab Results  Component Value Date   TSH 0.858 12/18/2010    Therapeutic Level Labs: No results found for: LITHIUM No results found for: VALPROATE No components found for:  CBMZ  Current Medications: Current Outpatient Medications  Medication Sig Dispense Refill  . Blood Glucose Monitoring Suppl (ONE TOUCH ULTRA SYSTEM KIT) W/DEVICE KIT 1 kit by Does not apply route once. 1 each 11  . buPROPion (WELLBUTRIN XL) 150 MG 24 hr tablet TAKE 1 TABLET BY MOUTH ONCE DAILY 90 tablet 1  . escitalopram (LEXAPRO) 20 MG tablet TAKE 1 TABLET BY MOUTH ONCE DAILY 90 tablet 1  . glucose blood test strip One touch Ultra strips #100/11 refills, lancets #1 box, Checks BS bid and DX:250.00 100 each 11  . lisinopril (ZESTRIL) 20 MG tablet Take 1 tablet (20 mg total) by mouth daily. 90 tablet 3   No current facility-administered medications for this visit.     Medication Side Effects: none  Orders placed this visit:  No orders of the defined types were placed in this encounter.   Psychiatric Specialty Exam:  ROS  Height 5'  10" (1.778 m), weight 215 lb (97.5 kg).Body mass index is 30.85 kg/m.  General Appearance: Neat and Well Groomed  Eye Contact:  Good  Speech:  Clear and Coherent  Volume:  Normal  Mood:  Anxious, Depressed and Irritable  Affect:  Appropriate  Thought Process:  Coherent  Orientation:  Full (Time, Place, and Person)  Thought Content: Logical  Suicidal Thoughts:  No  Homicidal Thoughts:  No  Memory:  WNL  Judgement:  Good  Insight:  Good  Psychomotor Activity:  Normal  Concentration:  Concentration: Good  Recall:  Good  Fund of Knowledge: Good  Language: Good  Assets:  Communication Skills Desire for Improvement Financial Resources/Insurance Housing Intimacy Leisure Time Physical Health Resilience Social Support Talents/Skills Transportation Vocational/Educational  ADL's:  Intact  Cognition: WNL  Prognosis:  Good   Screenings:  PHQ2-9     Office Visit from 04/22/2019 in [facility] Office Visit from 03/28/2019 in [facility] Office Visit from 11/08/2018 in [facility] Visit from 09/12/2018 in [facility] Office Visit from 04/02/2018 in [facility]  PHQ-2 Total Score  0  1  0  2  0  PHQ-9 Total Score  -  -  -  8  -      Receiving Psychotherapy: No   Treatment Plan/Recommendations:   Plan:  1. Continue Lexapro 32m daily 2. Increase Wellbutrin XL 1533mdaily to 30071maily  RTC 4 weeks  Patient advised to contact office with any questions, adverse effects, or acute worsening in signs and symptoms.   RegAloha GellP

## 2019-08-29 ENCOUNTER — Other Ambulatory Visit: Payer: Self-pay

## 2019-08-29 ENCOUNTER — Encounter: Payer: Self-pay | Admitting: Adult Health

## 2019-08-29 ENCOUNTER — Ambulatory Visit (INDEPENDENT_AMBULATORY_CARE_PROVIDER_SITE_OTHER): Payer: BC Managed Care – PPO | Admitting: Adult Health

## 2019-08-29 DIAGNOSIS — F331 Major depressive disorder, recurrent, moderate: Secondary | ICD-10-CM | POA: Diagnosis not present

## 2019-08-29 DIAGNOSIS — F411 Generalized anxiety disorder: Secondary | ICD-10-CM | POA: Diagnosis not present

## 2019-08-29 DIAGNOSIS — G4700 Insomnia, unspecified: Secondary | ICD-10-CM | POA: Diagnosis not present

## 2019-08-29 NOTE — Progress Notes (Signed)
[facility] MD/PA/NP Medication Check  08/29/2019 8:18 AM WILLETT LEFEBER  MRN:  [id number]  Chief Complaint:   HPI:   Describes mood today as "ok". Pleasant. Mood symptoms - reports decreased depression, anxiety, and irritability. Stating "I feel good". Stating "2 weeks into it, I felt better". Does not feel like medication changes were necessarily responsible. Has decided to retire November 23. Stating "I've worked for 50 years and I'm ready to let it go". He and wife plan to travel across the country in there RV. Stable interest and motivation. Taking medications as prescribed.  Energy levels stable. Active, does not have a regular exercise routine. Works full-time - 40 hours - [employer].  Enjoys some usual interests and activities. Married - lives with wife of 72 years. Has 1 daughter and 2 step-daughters. Spending time with family. Camping.  Appetite adequate. Weight stable. Sleeps well most nights. Averages 6 to 7 hours.  Focus and concentration stable. Completing tasks. Managing aspects of household. Doing better in work setting. Denies SI or HI. Denies AH or VH.  Visit Diagnosis:    ICD-10-CM   1. Generalized anxiety disorder  F41.1   2. Major depressive disorder, recurrent episode, moderate (HCC)  F33.1   3. Insomnia, unspecified type  G47.00    Past Psychiatric History: Denies  Past Medical History:  Past Medical History:  Diagnosis Date  . ADD (attention deficit disorder)   . Anxiety   . Atrial fibrillation with RVR ([place])    new found on 12/18/10 visit  . Bipolar disorder ([place]) 01/2014  . BPH with elevated PSA   . Depression   . Diabetes mellitus without complication ([place])   . GERD (gastroesophageal reflux disease)   . Hyperlipidemia   . Hypertension   . Myocardial infarction ([place])   . Sinus arrhythmia     Past Surgical History:  Procedure Laterality Date  . bilateral inguinal hernia    . CARPAL TUNNEL RELEASE  1999   left hand  . CERVICAL FUSION  2007   c5-c6  . COLONOSCOPY    . compound fracture  1999, 2001, 2001   right leg, insertion of hardware in 2001, removal of hardware 2001  . FRACTURE SURGERY    . LEG SURGERY Right    hardware removal  . PROSTATE BIOPSY     TRUS/BX  BPH only  . TONSILLECTOMY  1970   Family Psychiatric History: Denies  Family History:  Family History  Problem Relation Age of Onset  . Colon cancer Brother 20  . Breast cancer Sister        mid 36's  . Diabetes Mother   . Heart attack Father 83       massive /died  . Other Father        blood infection   Social History:  Social History   Socioeconomic History  . Marital status: Married    Spouse name: Suanne Marker  . Number of children: 3  . Years of education: Not on file  . Highest education level: Not on file  Occupational History  . Occupation: SCHEDULING/planner    Employer: [employer]  Social Needs  . Financial resource strain: Not on file  . Food insecurity    Worry: Not on file    Inability: Not on file  . Transportation needs    Medical: Not on file    Non-medical: Not on file  Tobacco Use  . Smoking status: Former Smoker    Years: 4.00    Types:  Cigarettes    Quit date: 05/29/1984    Years since quitting: 35.2  . Smokeless tobacco: Never Used  Substance and Sexual Activity  . Alcohol use: Yes    Alcohol/week: 1.0 standard drinks    Types: 1 Standard drinks or equivalent per week    Comment: social  . Drug use: No  . Sexual activity: Yes  Lifestyle  . Physical activity    Days per week: Not on file    Minutes per session: Not on file  . Stress: Not on file  Relationships  . Social Herbalist on phone: Not on file    Gets together: Not on file    Attends religious service: Not on file    Active member of club or organization: Not on file    Attends meetings of clubs or organizations: Not on file    Relationship status: Not on file  Other Topics Concern  . Not on file  Social History Narrative  .  Not on file   Allergies: No Known Allergies  Metabolic Disorder Labs: Lab Results  Component Value Date   HGBA1C 7.1 (H) 03/28/2019   MPG 128 (H) 03/07/2013   MPG 140 (H) 12/18/2010   No results found for: PROLACTIN Lab Results  Component Value Date   CHOL 193 03/28/2019   TRIG 107 03/28/2019   HDL 44 03/28/2019   CHOLHDL 4.4 03/28/2019   VLDL 21 03/07/2013   LDLCALC 128 (H) 03/28/2019   [place] 80 07/13/2017   Lab Results  Component Value Date   TSH 0.858 12/18/2010    Therapeutic Level Labs: No results found for: LITHIUM No results found for: VALPROATE No components found for:  CBMZ  Current Medications: Current Outpatient Medications  Medication Sig Dispense Refill  . Blood Glucose Monitoring Suppl (ONE TOUCH ULTRA SYSTEM KIT) W/DEVICE KIT 1 kit by Does not apply route once. 1 each 11  . buPROPion (WELLBUTRIN XL) 150 MG 24 hr tablet TAKE 1 TABLET BY MOUTH ONCE DAILY 90 tablet 1  . buPROPion (WELLBUTRIN XL) 150 MG 24 hr tablet Take one tablet every morning for 7 days, then take two tablets every morning. 180 tablet 1  . escitalopram (LEXAPRO) 20 MG tablet TAKE 1 TABLET BY MOUTH ONCE DAILY 90 tablet 1  . glucose blood test strip One touch Ultra strips #100/11 refills, lancets #1 box, Checks BS bid and DX:250.00 100 each 11  . lisinopril (ZESTRIL) 20 MG tablet Take 1 tablet (20 mg total) by mouth daily. 90 tablet 3   No current facility-administered medications for this visit.     Medication Side Effects: none  Orders placed this visit:  No orders of the defined types were placed in this encounter.   Psychiatric Specialty Exam:  ROS  There were no vitals taken for this visit.There is no height or weight on file to calculate BMI.  General Appearance: Neat and Well Groomed  Eye Contact:  Good  Speech:  Clear and Coherent  Volume:  Normal  Mood:  Euthymic  Affect:  Appropriate  Thought Process:  Coherent  Orientation:  Full (Time, Place, and Person)  Thought  Content: Logical   Suicidal Thoughts:  No  Homicidal Thoughts:  No  Memory:  WNL  Judgement:  Good  Insight:  Good  Psychomotor Activity:  Normal  Concentration:  Concentration: Good  Recall:  Good  Fund of Knowledge: Good  Language: Good  Assets:  Communication Skills Desire for Improvement Financial Resources/Insurance Housing Intimacy  Leisure Time Physical Health Resilience Social Support Talents/Skills Transportation Vocational/Educational  ADL's:  Intact  Cognition: WNL  Prognosis:  Good   Screenings:  PHQ2-9     Office Visit from 04/22/2019 in [facility] Visit from 03/28/2019 in [facility] Visit from 11/08/2018 in [facility] Visit from 09/12/2018 in [facility] Office Visit from 04/02/2018 in [facility]  PHQ-2 Total Score  0  1  0  2  0  PHQ-9 Total Score  -  -  -  8  -      Receiving Psychotherapy: No   Treatment Plan/Recommendations:   Plan:  1. Continue Lexapro 59m daily 2. Continue Wellbutrin XL 3023mdaily  RTC prn  Patient advised to contact office with any questions, adverse effects, or acute worsening in signs and symptoms.   ReAloha GellNP

## 2020-06-28 ENCOUNTER — Other Ambulatory Visit: Payer: Self-pay | Admitting: Family Medicine

## 2020-06-28 DIAGNOSIS — F32A Depression, unspecified: Secondary | ICD-10-CM

## 2020-06-28 DIAGNOSIS — F419 Anxiety disorder, unspecified: Secondary | ICD-10-CM

## 2020-09-09 ENCOUNTER — Ambulatory Visit (INDEPENDENT_AMBULATORY_CARE_PROVIDER_SITE_OTHER): Payer: Self-pay | Admitting: Family Medicine

## 2020-09-09 ENCOUNTER — Other Ambulatory Visit: Payer: Self-pay

## 2020-09-09 ENCOUNTER — Encounter: Payer: Self-pay | Admitting: Family Medicine

## 2020-09-09 VITALS — BP 178/103 | HR 86 | Temp 98.0°F | Ht 70.0 in | Wt 215.0 lb

## 2020-09-09 DIAGNOSIS — F32A Depression, unspecified: Secondary | ICD-10-CM

## 2020-09-09 DIAGNOSIS — F320 Major depressive disorder, single episode, mild: Secondary | ICD-10-CM

## 2020-09-09 DIAGNOSIS — Z1211 Encounter for screening for malignant neoplasm of colon: Secondary | ICD-10-CM

## 2020-09-09 DIAGNOSIS — E119 Type 2 diabetes mellitus without complications: Secondary | ICD-10-CM

## 2020-09-09 DIAGNOSIS — E1169 Type 2 diabetes mellitus with other specified complication: Secondary | ICD-10-CM

## 2020-09-09 DIAGNOSIS — I152 Hypertension secondary to endocrine disorders: Secondary | ICD-10-CM

## 2020-09-09 DIAGNOSIS — F419 Anxiety disorder, unspecified: Secondary | ICD-10-CM

## 2020-09-09 DIAGNOSIS — E1159 Type 2 diabetes mellitus with other circulatory complications: Secondary | ICD-10-CM

## 2020-09-09 DIAGNOSIS — E785 Hyperlipidemia, unspecified: Secondary | ICD-10-CM

## 2020-09-09 LAB — BAYER DCA HB A1C WAIVED: HB A1C (BAYER DCA - WAIVED): 7.6 % — ABNORMAL HIGH (ref <7.0)

## 2020-09-09 MED ORDER — BUPROPION HCL ER (XL) 150 MG PO TB24: ORAL_TABLET | ORAL | 3 refills | Status: DC

## 2020-09-09 MED ORDER — LISINOPRIL-HYDROCHLOROTHIAZIDE 20-25 MG PO TABS: 1.0000 | ORAL_TABLET | Freq: Every day | ORAL | 3 refills | Status: DC

## 2020-09-09 MED ORDER — ESCITALOPRAM OXALATE 20 MG PO TABS: ORAL_TABLET | ORAL | 3 refills | Status: DC

## 2020-09-09 NOTE — Progress Notes (Signed)
BP (!) 181/98   Pulse 86   Temp 98 F (36.7 C)   Ht _0  (1.778 m)   Wt 215 lb (97.5 kg)   SpO2 96%   BMI 30.85 kg/m    Subjective:   Patient ID: Sean Price, male    DOB: 10/26/1956, 64 y.o.   MRN: [id number]  HPI: Sean Price is a 64 y.o. male presenting on 09/09/2020 for No chief complaint on file.   HPI  Type 2 diabetes mellitus Patient comes in today for recheck of his diabetes. Patient has been currently taking no medication. Patient is currently on an ACE inhibitor/ARB. Patient has not seen an ophthalmologist this year. Patient denies any issues with their feet. The symptom started onset as an adult hypertension ARE RELATED TO DM   Hypertension Patient is currently on lisinopril, and their blood pressure today is 181/98, he says is been running high at home as well.  Has not been seen in some time but he said consistently at home is running at least 150 or 786 or above systolic.[name] Patient denies any lightheadedness or dizziness. Patient denies headaches, blurred vision, chest pains, shortness of breath, or weakness. Denies any side effects from medication and is content with current medication.   Depression and anxiety Patient is coming in for recheck of depression anxiety.  He has been taking Lexapro and Wellbutrin and still feels like he is doing good on it although his wife says he is having some struggles now more recently.  Especially in the evenings he gets little more jumpy and jittery and when he is sleeping sometimes he has restless leg as well, this was a lot better when he initially started these medications but he has been on them for a long time and now starting to come back again.  Relevant past medical, surgical, family and social history reviewed and updated as indicated. Interim medical history since our last visit reviewed. Allergies and medications reviewed and updated.  Review of Systems  Constitutional: Negative for chills and fever.   Respiratory: Negative for shortness of breath and wheezing.   Cardiovascular: Negative for chest pain and leg swelling.  Musculoskeletal: Negative for back pain and gait problem.  Skin: Negative for rash.  Neurological: Negative for dizziness, weakness and light-headedness.  All other systems reviewed and are negative.   Per HPI unless specifically indicated above   Allergies as of 09/09/2020   No Known Allergies     Medication List       Accurate as of September 09, 2020  2:57 PM. If you have any questions, ask your nurse or doctor.        STOP taking these medications   glucose blood test strip Stopped by: Fransisca Kaufmann Stiven Kaspar, MD   ONE TOUCH ULTRA SYSTEM KIT w/Device Kit Stopped by: Fransisca Kaufmann Exie Chrismer, MD     TAKE these medications   buPROPion 150 MG 24 hr tablet Commonly known as: WELLBUTRIN XL TAKE 1 TABLET BY MOUTH ONCE DAILY   buPROPion 150 MG 24 hr tablet Commonly known as: WELLBUTRIN XL Take one tablet every morning for 7 days, then take two tablets every morning.   escitalopram 20 MG tablet Commonly known as: LEXAPRO TAKE 1 TABLET BY MOUTH ONCE DAILY (Needs to be seen before next refill)   lisinopril 20 MG tablet Commonly known as: ZESTRIL Take 1 tablet (20 mg total) by mouth daily.        Objective:   BP (!) 181/98  Pulse 86   Temp 98 F (36.7 C)   Ht $R'5\' 10"'WS$  (1.778 m)   Wt 215 lb (97.5 kg)   SpO2 96%   BMI 30.85 kg/m   Wt Readings from Last 3 Encounters:  09/09/20 215 lb (97.5 kg)  04/22/19 218 lb 6.4 oz (99.1 kg)  03/28/19 216 lb 6.4 oz (98.2 kg)    Physical Exam Vitals and nursing note reviewed.  Constitutional:      General: He is not in acute distress.    Appearance: He is well-developed. He is not diaphoretic.  Eyes:     General: No scleral icterus.    Conjunctiva/sclera: Conjunctivae normal.  Neck:     Thyroid: No thyromegaly.  Cardiovascular:     Rate and Rhythm: Normal rate and regular rhythm.     Heart sounds: Normal  heart sounds. No murmur heard.   Pulmonary:     Effort: Pulmonary effort is normal. No respiratory distress.     Breath sounds: Normal breath sounds. No wheezing.  Musculoskeletal:        General: Normal range of motion.     Cervical back: Neck supple.  Lymphadenopathy:     Cervical: No cervical adenopathy.  Skin:    General: Skin is warm and dry.     Findings: No rash.  Neurological:     Mental Status: He is alert and oriented to person, place, and time.     Coordination: Coordination normal.  Psychiatric:        Behavior: Behavior normal.     A1c is 7.6  Assessment & Plan:   Problem List Items Addressed This Visit      Cardiovascular and Mediastinum   Hypertension associated with diabetes ([place])   Relevant Medications   lisinopril-hydrochlorothiazide (ZESTORETIC) 20-25 MG tablet     Endocrine   Type 2 diabetes mellitus without complications (HCC) - Primary   Relevant Medications   lisinopril-hydrochlorothiazide (ZESTORETIC) 20-25 MG tablet   Other Relevant Orders   Bayer DCA Hb A1c Waived   Hyperlipidemia associated with type 2 diabetes mellitus (HCC)   Relevant Medications   lisinopril-hydrochlorothiazide (ZESTORETIC) 20-25 MG tablet     Other   Depression, major, single episode, mild (HCC)   Relevant Medications   buPROPion (WELLBUTRIN XL) 150 MG 24 hr tablet   escitalopram (LEXAPRO) 20 MG tablet    Other Visit Diagnoses    Depression, unspecified depression type       Relevant Medications   buPROPion (WELLBUTRIN XL) 150 MG 24 hr tablet   escitalopram (LEXAPRO) 20 MG tablet   Anxiety       Relevant Medications   buPROPion (WELLBUTRIN XL) 150 MG 24 hr tablet   escitalopram (LEXAPRO) 20 MG tablet   Colon cancer screening       Relevant Orders   Ambulatory referral to Gastroenterology      We will do diet and exercise see if we can bring down the blood sugars, no medication for now.  Increase the blood pressure to lisinopril hydrochlorothiazide from  lisinopril and have him follow-up in 3 months.  Keep close eye on the blood pressures and call me in some blood pressures over the next couple weeks. Follow up plan: Return in about 3 months (around 12/10/2020), or if symptoms worsen or fail to improve, for Hypertension diabetes.  Counseling provided for all of the vaccine components Orders Placed This Encounter  Procedures  . [facility] Hb A1c Waived    Caryl Pina, MD [facility] 09/09/2020, 2:57 PM

## 2020-09-09 NOTE — Addendum Note (Signed)
Addended by: Caryl Pina on: 09/09/2020 03:25 PM   Modules accepted: Orders

## 2020-09-10 LAB — CMP14+EGFR
ALT: 15 IU/L (ref 0–44)
AST: 11 IU/L (ref 0–40)
Albumin/Globulin Ratio: 2.1 (ref 1.2–2.2)
Albumin: 4.6 g/dL (ref 3.8–4.8)
Alkaline Phosphatase: 98 IU/L (ref 44–121)
BUN/Creatinine Ratio: 18 (ref 10–24)
BUN: 17 mg/dL (ref 8–27)
Bilirubin Total: 0.2 mg/dL (ref 0.0–1.2)
CO2: 25 mmol/L (ref 20–29)
Calcium: 9.6 mg/dL (ref 8.6–10.2)
Chloride: 103 mmol/L (ref 96–106)
Creatinine, Ser: 0.92 mg/dL (ref 0.76–1.27)
GFR calc Af Amer: 102 mL/min/{1.73_m2} (ref >59)
GFR calc non Af Amer: 88 mL/min/{1.73_m2} (ref >59)
Globulin, Total: 2.2 g/dL (ref 1.5–4.5)
Glucose: 128 mg/dL — ABNORMAL HIGH (ref 65–99)
Potassium: 4.9 mmol/L (ref 3.5–5.2)
Sodium: 142 mmol/L (ref 134–144)
Total Protein: 6.8 g/dL (ref 6.0–8.5)

## 2020-09-10 LAB — CBC WITH DIFFERENTIAL/PLATELET
Basophils Absolute: 0.1 10*3/uL (ref 0.0–0.2)
Basos: 1 %
EOS (ABSOLUTE): 0.3 10*3/uL (ref 0.0–0.4)
Eos: 4 %
Hematocrit: 46.6 % (ref 37.5–51.0)
Hemoglobin: 16.2 g/dL (ref 13.0–17.7)
Immature Grans (Abs): 0 10*3/uL (ref 0.0–0.1)
Immature Granulocytes: 0 %
Lymphocytes Absolute: 2.9 10*3/uL (ref 0.7–3.1)
Lymphs: 40 %
MCH: 30.5 pg (ref 26.6–33.0)
MCHC: 34.8 g/dL (ref 31.5–35.7)
MCV: 88 fL (ref 79–97)
Monocytes Absolute: 0.7 10*3/uL (ref 0.1–0.9)
Monocytes: 10 %
Neutrophils Absolute: 3.3 10*3/uL (ref 1.4–7.0)
Neutrophils: 45 %
Platelets: 251 10*3/uL (ref 150–450)
RBC: 5.32 x10E6/uL (ref 4.14–5.80)
RDW: 13.9 % (ref 11.6–15.4)
WBC: 7.2 10*3/uL (ref 3.4–10.8)

## 2020-09-10 LAB — LIPID PANEL
Chol/HDL Ratio: 6.5 ratio — ABNORMAL HIGH (ref 0.0–5.0)
Cholesterol, Total: 248 mg/dL — ABNORMAL HIGH (ref 100–199)
HDL: 38 mg/dL — ABNORMAL LOW (ref >39)
LDL Chol Calc (NIH): 174 mg/dL — ABNORMAL HIGH (ref 0–99)
Triglycerides: 194 mg/dL — ABNORMAL HIGH (ref 0–149)
VLDL Cholesterol Cal: 36 mg/dL (ref 5–40)

## 2020-09-10 MED ORDER — ROSUVASTATIN CALCIUM 10 MG PO TABS: 10.0000 mg | ORAL_TABLET | Freq: Every day | ORAL | 1 refills | Status: DC

## 2020-09-10 NOTE — Addendum Note (Signed)
Addended by: Nigel Berthold C on: 09/10/2020 11:22 AM   Modules accepted: Orders

## 2020-10-15 ENCOUNTER — Encounter: Payer: Self-pay | Admitting: Family Medicine

## 2020-12-11 ENCOUNTER — Ambulatory Visit (INDEPENDENT_AMBULATORY_CARE_PROVIDER_SITE_OTHER): Payer: Self-pay | Admitting: Family Medicine

## 2020-12-11 ENCOUNTER — Encounter: Payer: Self-pay | Admitting: Family Medicine

## 2020-12-11 ENCOUNTER — Other Ambulatory Visit: Payer: Self-pay

## 2020-12-11 VITALS — BP 134/83 | HR 86 | Ht 70.0 in | Wt 205.0 lb

## 2020-12-11 DIAGNOSIS — E119 Type 2 diabetes mellitus without complications: Secondary | ICD-10-CM

## 2020-12-11 DIAGNOSIS — F320 Major depressive disorder, single episode, mild: Secondary | ICD-10-CM

## 2020-12-11 DIAGNOSIS — E1169 Type 2 diabetes mellitus with other specified complication: Secondary | ICD-10-CM

## 2020-12-11 DIAGNOSIS — E1159 Type 2 diabetes mellitus with other circulatory complications: Secondary | ICD-10-CM

## 2020-12-11 DIAGNOSIS — I152 Hypertension secondary to endocrine disorders: Secondary | ICD-10-CM

## 2020-12-11 DIAGNOSIS — E785 Hyperlipidemia, unspecified: Secondary | ICD-10-CM

## 2020-12-11 MED ORDER — METFORMIN HCL ER 500 MG PO TB24: 500.0000 mg | ORAL_TABLET | Freq: Two times a day (BID) | ORAL | 3 refills | Status: DC

## 2020-12-11 NOTE — Progress Notes (Signed)
BP (!) 146/98   Pulse 86   Ht 5\' 10"  (1.778 m)   Wt 205 lb (93 kg)   SpO2 95%   BMI 29.41 kg/m    Subjective:   Patient ID: Sean Price, male    DOB: 01/29/1956, 65 y.o.   MRN: [id number]  HPI: Sean Price is a 65 y.o. male presenting on 12/11/2020 for Medical Management of Chronic Issues, Diabetes, and Hypertension   HPI Type 2 diabetes mellitus Patient comes in today for recheck of his diabetes. Patient has been currently taking no medication currently, A1c is worse at 8.3. Patient is currently on an ACE inhibitor/ARB. Patient has seen an ophthalmologist this year. Patient denies any issues with their feet. The symptom started onset as an adult hypertension hyperlipidemia ARE RELATED TO DM   Hypertension Patient is currently on lisinopril hydrochlorothiazide, and their blood pressure today is 146/98. Patient denies any lightheadedness or dizziness. Patient denies headaches, blurred vision, chest pains, shortness of breath, or weakness. Denies any side effects from medication and is content with current medication.   Hyperlipidemia Patient is coming in for recheck of his hyperlipidemia. The patient is currently taking Crestor. They deny any issues with myalgias or history of liver damage from it. They deny any focal numbness or weakness or chest pain.   Depression with anxiety Patient is coming in today for depression and anxiety recheck.  He says is doing very well on his Wellbutrin and denies any issues with that and started taking them both in the evening Lexapro and Wellbutrin.  Relevant past medical, surgical, family and social history reviewed and updated as indicated. Interim medical history since our last visit reviewed. Allergies and medications reviewed and updated.  Review of Systems  Constitutional: Negative for chills and fever.  Eyes: Negative for visual disturbance.  Respiratory: Negative for shortness of breath and wheezing.   Cardiovascular:  Negative for chest pain and leg swelling.  Musculoskeletal: Negative for back pain and gait problem.  Skin: Negative for rash.  Neurological: Negative for dizziness, weakness and light-headedness.  All other systems reviewed and are negative.   Per HPI unless specifically indicated above   Allergies as of 12/11/2020   No Known Allergies     Medication List       Accurate as of December 11, 2020  8:07 AM. If you have any questions, ask your nurse or doctor.        buPROPion 150 MG 24 hr tablet Commonly known as: WELLBUTRIN XL TAKE 1 TABLET BY MOUTH ONCE DAILY   escitalopram 20 MG tablet Commonly known as: LEXAPRO TAKE 1 TABLET BY MOUTH ONCE DAILY   lisinopril-hydrochlorothiazide 20-25 MG tablet Commonly known as: ZESTORETIC Take 1 tablet by mouth daily.   rosuvastatin 10 MG tablet Commonly known as: Crestor Take 1 tablet (10 mg total) by mouth daily.        Objective:   BP (!) 146/98   Pulse 86   Ht 5\' 10"  (1.778 m)   Wt 205 lb (93 kg)   SpO2 95%   BMI 29.41 kg/m   Wt Readings from Last 3 Encounters:  12/11/20 205 lb (93 kg)  09/09/20 215 lb (97.5 kg)  04/22/19 218 lb 6.4 oz (99.1 kg)    Physical Exam Vitals and nursing note reviewed.  Constitutional:      General: He is not in acute distress.    Appearance: He is well-developed and well-nourished. He is not diaphoretic.  Eyes:  General: No scleral icterus.    Extraocular Movements: EOM normal.     Conjunctiva/sclera: Conjunctivae normal.  Neck:     Thyroid: No thyromegaly.  Cardiovascular:     Rate and Rhythm: Normal rate and regular rhythm.     Pulses: Intact distal pulses.     Heart sounds: Normal heart sounds. No murmur heard.   Pulmonary:     Effort: Pulmonary effort is normal. No respiratory distress.     Breath sounds: Normal breath sounds. No wheezing.  Musculoskeletal:        General: No edema. Normal range of motion.     Cervical back: Neck supple.  Lymphadenopathy:      Cervical: No cervical adenopathy.  Skin:    General: Skin is warm and dry.     Findings: No rash.  Neurological:     Mental Status: He is alert and oriented to person, place, and time.     Coordination: Coordination normal.  Psychiatric:        Mood and Affect: Mood and affect normal.        Behavior: Behavior normal.     Hemoglobin A1c 8.3  Assessment & Plan:   Problem List Items Addressed This Visit      Cardiovascular and Mediastinum   Hypertension associated with diabetes ([place])   Relevant Medications   metFORMIN (GLUCOPHAGE XR) 500 MG 24 hr tablet     Endocrine   Type 2 diabetes mellitus without complications (HCC) - Primary   Relevant Medications   metFORMIN (GLUCOPHAGE XR) 500 MG 24 hr tablet   Other Relevant Orders   Bayer DCA Hb A1c Waived   Hyperlipidemia associated with type 2 diabetes mellitus (HCC)   Relevant Medications   metFORMIN (GLUCOPHAGE XR) 500 MG 24 hr tablet     Other   Depression, major, single episode, mild (HCC)      Continue current medication for blood pressure and cholesterol and will add Metformin for diabetes Follow up plan: Return in about 3 months (around 03/10/2021), or if symptoms worsen or fail to improve, for Diabetes and hypertension and cholesterol.  Counseling provided for all of the vaccine components Orders Placed This Encounter  Procedures  . Bayer [facility] Hb A1c [place], MD [facility] 12/11/2020, 8:07 AM

## 2020-12-11 NOTE — Addendum Note (Signed)
Addended by: Caryl Pina on: 12/11/2020 08:38 AM   Modules accepted: Level of Service

## 2020-12-16 LAB — BAYER DCA HB A1C WAIVED: HB A1C (BAYER DCA - WAIVED): 8.3 % — ABNORMAL HIGH (ref <7.0)

## 2020-12-26 ENCOUNTER — Encounter: Payer: Self-pay | Admitting: Family Medicine

## 2020-12-26 DIAGNOSIS — F320 Major depressive disorder, single episode, mild: Secondary | ICD-10-CM

## 2020-12-29 MED ORDER — PAROXETINE HCL 20 MG PO TABS: 20.0000 mg | ORAL_TABLET | Freq: Every day | ORAL | 1 refills | Status: DC

## 2021-03-01 ENCOUNTER — Other Ambulatory Visit: Payer: Self-pay | Admitting: Family Medicine

## 2021-03-01 DIAGNOSIS — F320 Major depressive disorder, single episode, mild: Secondary | ICD-10-CM

## 2021-03-12 ENCOUNTER — Ambulatory Visit (INDEPENDENT_AMBULATORY_CARE_PROVIDER_SITE_OTHER): Payer: Self-pay | Admitting: Family Medicine

## 2021-03-12 ENCOUNTER — Other Ambulatory Visit: Payer: Self-pay

## 2021-03-12 ENCOUNTER — Encounter: Payer: Self-pay | Admitting: Family Medicine

## 2021-03-12 VITALS — BP 141/91 | HR 85 | Ht 70.0 in | Wt 187.0 lb

## 2021-03-12 DIAGNOSIS — I152 Hypertension secondary to endocrine disorders: Secondary | ICD-10-CM

## 2021-03-12 DIAGNOSIS — F320 Major depressive disorder, single episode, mild: Secondary | ICD-10-CM

## 2021-03-12 DIAGNOSIS — E1159 Type 2 diabetes mellitus with other circulatory complications: Secondary | ICD-10-CM

## 2021-03-12 DIAGNOSIS — R634 Abnormal weight loss: Secondary | ICD-10-CM

## 2021-03-12 DIAGNOSIS — E1169 Type 2 diabetes mellitus with other specified complication: Secondary | ICD-10-CM

## 2021-03-12 DIAGNOSIS — G2581 Restless legs syndrome: Secondary | ICD-10-CM

## 2021-03-12 DIAGNOSIS — E785 Hyperlipidemia, unspecified: Secondary | ICD-10-CM

## 2021-03-12 DIAGNOSIS — E119 Type 2 diabetes mellitus without complications: Secondary | ICD-10-CM

## 2021-03-12 LAB — BAYER DCA HB A1C WAIVED: HB A1C (BAYER DCA - WAIVED): 7.7 % — ABNORMAL HIGH (ref <7.0)

## 2021-03-12 MED ORDER — EMPAGLIFLOZIN 25 MG PO TABS: 25.0000 mg | ORAL_TABLET | Freq: Every day | ORAL | 3 refills | Status: DC

## 2021-03-12 MED ORDER — GLIPIZIDE 5 MG PO TABS: 5.0000 mg | ORAL_TABLET | Freq: Two times a day (BID) | ORAL | 3 refills | Status: DC

## 2021-03-12 MED ORDER — ROSUVASTATIN CALCIUM 10 MG PO TABS: 10.0000 mg | ORAL_TABLET | Freq: Every day | ORAL | 3 refills | Status: DC

## 2021-03-12 MED ORDER — PAROXETINE HCL 20 MG PO TABS: 20.0000 mg | ORAL_TABLET | Freq: Every day | ORAL | 3 refills | Status: DC

## 2021-03-12 MED ORDER — LISINOPRIL-HYDROCHLOROTHIAZIDE 20-25 MG PO TABS: 0.5000 | ORAL_TABLET | Freq: Every day | ORAL | 3 refills | Status: DC

## 2021-03-12 NOTE — Addendum Note (Signed)
Addended by: Caryl Pina on: 03/12/2021 08:50 AM   Modules accepted: Orders

## 2021-03-12 NOTE — Progress Notes (Addendum)
BP (!) 141/91   Pulse 85   Ht '5\' 10"'  (1.778 m)   Wt 187 lb (84.8 kg)   SpO2 98%   BMI 26.83 kg/m    Subjective:   Patient ID: Sean Price, male    DOB: 04/17/1956, 65 y.o.   MRN: [id number]  HPI: Sean Price is a 65 y.o. male presenting on 03/12/2021 for Medical Management of Chronic Issues, Diabetes, and Hyperlipidemia   HPI Type 2 diabetes mellitus Patient comes in today for recheck of his diabetes. Patient has been currently taking metformin. Patient is currently on an ACE inhibitor/ARB. Patient has not seen an ophthalmologist this year. Patient denies any issues with their feet. The symptom started onset as an adult hypertension and hyperlipidemia ARE RELATED TO DM   Hypertension Patient is currently on lisinopril hydrochlorothiazide, and their blood pressure today is 141/91. Patient gets the occasional lightheadedness and has checked his blood pressures at home when he feels that way its been 95/55 and this is happening at least 2-3 times a week.. Patient denies headaches, blurred vision, chest pains, shortness of breath, or weakness. Denies any side effects from medication and is content with current medication.   Hyperlipidemia Patient is coming in for recheck of his hyperlipidemia. The patient is currently taking Crestor. They deny any issues with myalgias or history of liver damage from it. They deny any focal numbness or weakness or chest pain.   Depression and anxiety recheck Patient is currently taking Paxil for depression and anxiety.  He says he does get some sleep disturbance that affects his mood some but not significantly.  He is getting restless legs and he talks while he sleeps and that sometimes keeps him up at night.  Patient also complains that he is eating less and has an appetite but sometimes has nausea.  He has had some difficulties with metformin before but he says it is better but sometimes he does feel like things are getting stuck or coming  back up, specifically when he has a lot of coffee.  Relevant past medical, surgical, family and social history reviewed and updated as indicated. Interim medical history since our last visit reviewed. Allergies and medications reviewed and updated.  Review of Systems  Constitutional: Negative for chills and fever.  Eyes: Negative for visual disturbance.  Respiratory: Negative for shortness of breath and wheezing.   Cardiovascular: Negative for chest pain and leg swelling.  Gastrointestinal: Positive for nausea. Negative for abdominal distention, abdominal pain, blood in stool, constipation, diarrhea and vomiting.  Musculoskeletal: Negative for back pain and gait problem.  Skin: Negative for rash.  Neurological: Positive for light-headedness. Negative for speech difficulty, weakness and headaches.  Psychiatric/Behavioral: Positive for sleep disturbance. Negative for decreased concentration and dysphoric mood.  All other systems reviewed and are negative.   Per HPI unless specifically indicated above   Allergies as of 03/12/2021   No Known Allergies     Medication List       Accurate as of Mar 12, 2021  8:11 AM. If you have any questions, ask your nurse or doctor.        buPROPion 150 MG 24 hr tablet Commonly known as: WELLBUTRIN XL TAKE 1 TABLET BY MOUTH ONCE DAILY   lisinopril-hydrochlorothiazide 20-25 MG tablet Commonly known as: ZESTORETIC Take 1 tablet by mouth daily.   metFORMIN 500 MG 24 hr tablet Commonly known as: Glucophage XR Take 1 tablet (500 mg total) by mouth in the morning and at bedtime.  PARoxetine 20 MG tablet Commonly known as: PAXIL Take 1 tablet by mouth once daily   rosuvastatin 10 MG tablet Commonly known as: Crestor Take 1 tablet (10 mg total) by mouth daily.        Objective:   BP (!) 141/91   Pulse 85   Ht '5\' 10"'  (1.778 m)   Wt 187 lb (84.8 kg)   SpO2 98%   BMI 26.83 kg/m   Wt Readings from Last 3 Encounters:  03/12/21 187 lb  (84.8 kg)  12/11/20 205 lb (93 kg)  09/09/20 215 lb (97.5 kg)    Physical Exam Vitals and nursing note reviewed.  Constitutional:      General: He is not in acute distress.    Appearance: He is well-developed. He is not diaphoretic.  Eyes:     General: No scleral icterus.    Conjunctiva/sclera: Conjunctivae normal.  Neck:     Thyroid: No thyromegaly.  Cardiovascular:     Rate and Rhythm: Normal rate and regular rhythm.     Heart sounds: Normal heart sounds. No murmur heard.   Pulmonary:     Effort: Pulmonary effort is normal. No respiratory distress.     Breath sounds: Normal breath sounds. No wheezing.  Abdominal:     General: Abdomen is flat. Bowel sounds are normal. There is no distension.     Tenderness: There is no abdominal tenderness. There is no guarding or rebound.  Musculoskeletal:        General: Normal range of motion.     Cervical back: Neck supple.  Lymphadenopathy:     Cervical: No cervical adenopathy.  Skin:    General: Skin is warm and dry.     Findings: No rash.  Neurological:     Mental Status: He is alert and oriented to person, place, and time.     Coordination: Coordination normal.  Psychiatric:        Behavior: Behavior normal.       Assessment & Plan:   Problem List Items Addressed This Visit      Cardiovascular and Mediastinum   Hypertension associated with diabetes ([place])   Relevant Medications   rosuvastatin (CRESTOR) 10 MG tablet   lisinopril-hydrochlorothiazide (ZESTORETIC) 20-25 MG tablet   empagliflozin (JARDIANCE) 25 MG TABS tablet   Other Relevant Orders   CBC with Differential/Platelet   CMP14+EGFR   Lipid panel   Bayer DCA Hb A1c Waived     Endocrine   Type 2 diabetes mellitus without complications (HCC) - Primary   Relevant Medications   rosuvastatin (CRESTOR) 10 MG tablet   lisinopril-hydrochlorothiazide (ZESTORETIC) 20-25 MG tablet   empagliflozin (JARDIANCE) 25 MG TABS tablet   Other Relevant Orders   CBC with  Differential/Platelet   CMP14+EGFR   Lipid panel   Bayer DCA Hb A1c Waived   Hyperlipidemia associated with type 2 diabetes mellitus (HCC)   Relevant Medications   rosuvastatin (CRESTOR) 10 MG tablet   lisinopril-hydrochlorothiazide (ZESTORETIC) 20-25 MG tablet   empagliflozin (JARDIANCE) 25 MG TABS tablet   Other Relevant Orders   CBC with Differential/Platelet   CMP14+EGFR   Lipid panel   Bayer DCA Hb A1c Waived     Other   Depression, major, single episode, mild (HCC)   Relevant Medications   PARoxetine (PAXIL) 20 MG tablet    Other Visit Diagnoses    Unexplained weight loss        Continue current medication, recommended potassium and magnesium to help with restless legs and working  out in the evening to help with restless legs.  See if this helps him with sleep if not may consider neurology referral in the future for possible sleep study. Cut blood pressure pill in half because he is lightheaded and dizzy a few times a week and his blood pressure down at home.  For weight loss I think it is related to stomach issues.  A1c 7.7, will stop the metformin but will go on Jardiance.  Without insurance I do not know if he is going to get Jardiance so we will send glipizide as well, gave coupon card for Jardiance.  Follow up plan: Return in about 3 months (around 06/12/2021), or if symptoms worsen or fail to improve, for Diabetes and hypertension and cholesterol and weight loss.  Counseling provided for all of the vaccine components Orders Placed This Encounter  Procedures  . CBC with Differential/Platelet  . CMP14+EGFR  . Lipid panel  . Bayer [facility] Hb A1c [place], MD [facility] 03/12/2021, 8:11 AM

## 2021-03-12 NOTE — Addendum Note (Signed)
Addended by: Caryl Pina on: 03/12/2021 11:08 AM   Modules accepted: Orders

## 2021-03-13 LAB — CMP14+EGFR
ALT: 13 IU/L (ref 0–44)
AST: 8 IU/L (ref 0–40)
Albumin/Globulin Ratio: 1.8 (ref 1.2–2.2)
Albumin: 4.2 g/dL (ref 3.8–4.8)
Alkaline Phosphatase: 85 IU/L (ref 44–121)
BUN/Creatinine Ratio: 19 (ref 10–24)
BUN: 16 mg/dL (ref 8–27)
Bilirubin Total: <0.2 mg/dL (ref 0.0–1.2)
CO2: 25 mmol/L (ref 20–29)
Calcium: 9.5 mg/dL (ref 8.6–10.2)
Chloride: 105 mmol/L (ref 96–106)
Creatinine, Ser: 0.86 mg/dL (ref 0.76–1.27)
Globulin, Total: 2.3 g/dL (ref 1.5–4.5)
Glucose: 177 mg/dL — ABNORMAL HIGH (ref 65–99)
Potassium: 4.8 mmol/L (ref 3.5–5.2)
Sodium: 142 mmol/L (ref 134–144)
Total Protein: 6.5 g/dL (ref 6.0–8.5)
eGFR: 97 mL/min/{1.73_m2} (ref >59)

## 2021-03-13 LAB — CBC WITH DIFFERENTIAL/PLATELET
Basophils Absolute: 0.1 10*3/uL (ref 0.0–0.2)
Basos: 1 %
EOS (ABSOLUTE): 0.4 10*3/uL (ref 0.0–0.4)
Eos: 5 %
Hematocrit: 40.8 % (ref 37.5–51.0)
Hemoglobin: 14.2 g/dL (ref 13.0–17.7)
Immature Grans (Abs): 0 10*3/uL (ref 0.0–0.1)
Immature Granulocytes: 0 %
Lymphocytes Absolute: 2.4 10*3/uL (ref 0.7–3.1)
Lymphs: 33 %
MCH: 31.9 pg (ref 26.6–33.0)
MCHC: 34.8 g/dL (ref 31.5–35.7)
MCV: 92 fL (ref 79–97)
Monocytes Absolute: 0.6 10*3/uL (ref 0.1–0.9)
Monocytes: 9 %
Neutrophils Absolute: 3.7 10*3/uL (ref 1.4–7.0)
Neutrophils: 52 %
Platelets: 246 10*3/uL (ref 150–450)
RBC: 4.45 x10E6/uL (ref 4.14–5.80)
RDW: 13 % (ref 11.6–15.4)
WBC: 7.1 10*3/uL (ref 3.4–10.8)

## 2021-03-13 LAB — LIPID PANEL
Chol/HDL Ratio: 2.8 ratio (ref 0.0–5.0)
Cholesterol, Total: 131 mg/dL (ref 100–199)
HDL: 47 mg/dL (ref >39)
LDL Chol Calc (NIH): 61 mg/dL (ref 0–99)
Triglycerides: 134 mg/dL (ref 0–149)
VLDL Cholesterol Cal: 23 mg/dL (ref 5–40)

## 2021-06-24 ENCOUNTER — Ambulatory Visit (INDEPENDENT_AMBULATORY_CARE_PROVIDER_SITE_OTHER): Payer: Self-pay | Admitting: Family Medicine

## 2021-06-24 ENCOUNTER — Other Ambulatory Visit: Payer: Self-pay

## 2021-06-24 ENCOUNTER — Encounter: Payer: Self-pay | Admitting: Family Medicine

## 2021-06-24 VITALS — BP 146/101 | HR 79 | Temp 97.2°F | Resp 20 | Ht 70.0 in | Wt 186.0 lb

## 2021-06-24 DIAGNOSIS — E1159 Type 2 diabetes mellitus with other circulatory complications: Secondary | ICD-10-CM

## 2021-06-24 DIAGNOSIS — I152 Hypertension secondary to endocrine disorders: Secondary | ICD-10-CM

## 2021-06-24 DIAGNOSIS — E785 Hyperlipidemia, unspecified: Secondary | ICD-10-CM

## 2021-06-24 DIAGNOSIS — E1169 Type 2 diabetes mellitus with other specified complication: Secondary | ICD-10-CM

## 2021-06-24 DIAGNOSIS — E119 Type 2 diabetes mellitus without complications: Secondary | ICD-10-CM

## 2021-06-24 LAB — CMP14+EGFR
ALT: 18 IU/L (ref 0–44)
AST: 9 IU/L (ref 0–40)
Albumin/Globulin Ratio: 2.5 — ABNORMAL HIGH (ref 1.2–2.2)
Albumin: 4.5 g/dL (ref 3.8–4.8)
Alkaline Phosphatase: 97 IU/L (ref 44–121)
BUN/Creatinine Ratio: 16 (ref 10–24)
BUN: 15 mg/dL (ref 8–27)
Bilirubin Total: <0.2 mg/dL (ref 0.0–1.2)
CO2: 24 mmol/L (ref 20–29)
Calcium: 9.8 mg/dL (ref 8.6–10.2)
Chloride: 102 mmol/L (ref 96–106)
Creatinine, Ser: 0.95 mg/dL (ref 0.76–1.27)
Globulin, Total: 1.8 g/dL (ref 1.5–4.5)
Glucose: 151 mg/dL — ABNORMAL HIGH (ref 65–99)
Potassium: 4.9 mmol/L (ref 3.5–5.2)
Sodium: 142 mmol/L (ref 134–144)
Total Protein: 6.3 g/dL (ref 6.0–8.5)
eGFR: 89 mL/min/{1.73_m2} (ref >59)

## 2021-06-24 LAB — CBC WITH DIFFERENTIAL/PLATELET
Basophils Absolute: 0.1 10*3/uL (ref 0.0–0.2)
Basos: 1 %
EOS (ABSOLUTE): 0.3 10*3/uL (ref 0.0–0.4)
Eos: 4 %
Hematocrit: 42.9 % (ref 37.5–51.0)
Hemoglobin: 14.6 g/dL (ref 13.0–17.7)
Immature Grans (Abs): 0 10*3/uL (ref 0.0–0.1)
Immature Granulocytes: 1 %
Lymphocytes Absolute: 2.9 10*3/uL (ref 0.7–3.1)
Lymphs: 33 %
MCH: 30.5 pg (ref 26.6–33.0)
MCHC: 34 g/dL (ref 31.5–35.7)
MCV: 90 fL (ref 79–97)
Monocytes Absolute: 0.7 10*3/uL (ref 0.1–0.9)
Monocytes: 8 %
Neutrophils Absolute: 4.7 10*3/uL (ref 1.4–7.0)
Neutrophils: 53 %
Platelets: 287 10*3/uL (ref 150–450)
RBC: 4.79 x10E6/uL (ref 4.14–5.80)
RDW: 12.3 % (ref 11.6–15.4)
WBC: 8.8 10*3/uL (ref 3.4–10.8)

## 2021-06-24 LAB — LIPID PANEL
Chol/HDL Ratio: 3 ratio (ref 0.0–5.0)
Cholesterol, Total: 142 mg/dL (ref 100–199)
HDL: 48 mg/dL (ref >39)
LDL Chol Calc (NIH): 75 mg/dL (ref 0–99)
Triglycerides: 106 mg/dL (ref 0–149)
VLDL Cholesterol Cal: 19 mg/dL (ref 5–40)

## 2021-06-24 LAB — BAYER DCA HB A1C WAIVED: HB A1C (BAYER DCA - WAIVED): 7.4 % — ABNORMAL HIGH (ref <7.0)

## 2021-06-24 NOTE — Progress Notes (Addendum)
There were no vitals taken for this visit.   Subjective:   Patient ID: Sean Price, male    DOB: June 08, 1956, 65 y.o.   MRN: [id number]  HPI: Sean Price is a 65 y.o. male presenting on 06/24/2021 for Medical Management of Chronic Issues   HPI Type 2 diabetes mellitus Patient comes in today for recheck of his diabetes. Patient has been currently taking glipizide and doing diet, A1c is 7.4. Patient is currently on an ACE inhibitor/ARB. Patient has seen an ophthalmologist this year. Patient denies any issues with their feet. The symptom started onset as an adult hypertension and hyperlipidemia ARE RELATED TO DM   Hypertension Patient is currently on lisinopril hydrochlorothiazide, and their blood pressure today is 163/92 pressures at home are typically runs in the 100s over 50s and never runs this high.. Patient denies any lightheadedness or dizziness. Patient denies headaches, blurred vision, chest pains, shortness of breath, or weakness. Denies any side effects from medication and is content with current medication.   Hyperlipidemia Patient is coming in for recheck of his hyperlipidemia. The patient is currently taking Crestor. They deny any issues with myalgias or history of liver damage from it. They deny any focal numbness or weakness or chest pain.   Relevant past medical, surgical, family and social history reviewed and updated as indicated. Interim medical history since our last visit reviewed. Allergies and medications reviewed and updated.  Review of Systems  Constitutional:  Negative for chills and fever.  Respiratory:  Negative for shortness of breath and wheezing.   Cardiovascular:  Negative for chest pain and leg swelling.  Musculoskeletal:  Negative for back pain and gait problem.  Skin:  Negative for rash.  All other systems reviewed and are negative.  Per HPI unless specifically indicated above   Allergies as of 06/24/2021   No Known Allergies       Medication List        Accurate as of June 24, 2021  9:04 AM. If you have any questions, ask your nurse or doctor.          buPROPion 150 MG 24 hr tablet Commonly known as: WELLBUTRIN XL TAKE 1 TABLET BY MOUTH ONCE DAILY   empagliflozin 25 MG Tabs tablet Commonly known as: Jardiance Take 1 tablet (25 mg total) by mouth daily before breakfast.   glipiZIDE 5 MG tablet Commonly known as: GLUCOTROL Take 1 tablet (5 mg total) by mouth 2 (two) times daily before a meal.   lisinopril-hydrochlorothiazide 20-25 MG tablet Commonly known as: ZESTORETIC Take 0.5 tablets by mouth daily.   PARoxetine 20 MG tablet Commonly known as: PAXIL Take 1 tablet (20 mg total) by mouth daily.   rosuvastatin 10 MG tablet Commonly known as: Crestor Take 1 tablet (10 mg total) by mouth daily.         Objective:   There were no vitals taken for this visit.  Wt Readings from Last 3 Encounters:  03/12/21 187 lb (84.8 kg)  12/11/20 205 lb (93 kg)  09/09/20 215 lb (97.5 kg)    Physical Exam Vitals and nursing note reviewed.  Constitutional:      General: He is not in acute distress.    Appearance: He is well-developed. He is not diaphoretic.  Eyes:     General: No scleral icterus.       Right eye: No discharge.     Conjunctiva/sclera: Conjunctivae normal.     Pupils: Pupils are equal, round, and reactive to  light.  Neck:     Thyroid: No thyromegaly.  Cardiovascular:     Rate and Rhythm: Normal rate and regular rhythm.     Heart sounds: Normal heart sounds. No murmur heard. Pulmonary:     Effort: Pulmonary effort is normal. No respiratory distress.     Breath sounds: Normal breath sounds. No wheezing.  Musculoskeletal:        General: Normal range of motion.     Cervical back: Neck supple.  Lymphadenopathy:     Cervical: No cervical adenopathy.  Skin:    General: Skin is warm and dry.     Findings: No rash.  Neurological:     Mental Status: He is alert and oriented to  person, place, and time.     Coordination: Coordination normal.  Psychiatric:        Behavior: Behavior normal.      Assessment & Plan:   Problem List Items Addressed This Visit       Cardiovascular and Mediastinum   Hypertension associated with diabetes ([place])   Relevant Orders   Bayer DCA Hb A1c Waived   Microalbumin / creatinine urine ratio   CBC with Differential/Platelet   CMP14+EGFR   Lipid panel     Endocrine   Type 2 diabetes mellitus without complications (HCC) - Primary   Relevant Orders   Bayer DCA Hb A1c Waived   Microalbumin / creatinine urine ratio   CBC with Differential/Platelet   CMP14+EGFR   Lipid panel   Hyperlipidemia associated with type 2 diabetes mellitus ([place])   Relevant Orders   Bayer DCA Hb A1c Waived   Microalbumin / creatinine urine ratio   CBC with Differential/Platelet   CMP14+EGFR   Lipid panel    A1c is much improved at 7.4, continue current medicines for now.  He is getting insurance next year so that should help with some things.  Patient says he does have some left knee arthritis and swelling but does not want to do anything about it today, we discussed possible injections in the future Follow up plan: Return in about 3 months (around 09/24/2021), or if symptoms worsen or fail to improve, for Diabetes.  Counseling provided for all of the vaccine components Orders Placed This Encounter  Procedures   Bayer DCA Hb A1c Waived   Microalbumin / creatinine urine ratio   CBC with Differential/Platelet   CMP14+EGFR   Lipid panel    Caryl Pina, MD [facility] 06/24/2021, 9:04 AM

## 2021-09-04 ENCOUNTER — Other Ambulatory Visit: Payer: Self-pay | Admitting: Adult Health

## 2021-09-04 DIAGNOSIS — F320 Major depressive disorder, single episode, mild: Secondary | ICD-10-CM

## 2021-09-27 ENCOUNTER — Encounter: Payer: Self-pay | Admitting: Family Medicine

## 2021-09-27 ENCOUNTER — Ambulatory Visit: Payer: Self-pay | Admitting: Family Medicine

## 2021-09-27 VITALS — BP 135/79 | HR 79 | Ht 70.0 in | Wt 194.0 lb

## 2021-09-27 DIAGNOSIS — E1169 Type 2 diabetes mellitus with other specified complication: Secondary | ICD-10-CM

## 2021-09-27 DIAGNOSIS — I152 Hypertension secondary to endocrine disorders: Secondary | ICD-10-CM

## 2021-09-27 DIAGNOSIS — E1159 Type 2 diabetes mellitus with other circulatory complications: Secondary | ICD-10-CM

## 2021-09-27 DIAGNOSIS — E785 Hyperlipidemia, unspecified: Secondary | ICD-10-CM

## 2021-09-27 DIAGNOSIS — E119 Type 2 diabetes mellitus without complications: Secondary | ICD-10-CM

## 2021-09-27 DIAGNOSIS — F320 Major depressive disorder, single episode, mild: Secondary | ICD-10-CM

## 2021-09-27 LAB — BAYER DCA HB A1C WAIVED: HB A1C (BAYER DCA - WAIVED): 7 % — ABNORMAL HIGH (ref 4.8–5.6)

## 2021-09-27 MED ORDER — BUPROPION HCL ER (XL) 300 MG PO TB24: 300.0000 mg | ORAL_TABLET | Freq: Every day | ORAL | 3 refills | Status: DC

## 2021-09-27 NOTE — Progress Notes (Signed)
BP 135/79   Pulse 79   Ht 5\' 10"  (1.778 m)   Wt 194 lb (88 kg)   SpO2 98%   BMI 27.84 kg/m    Subjective:   Patient ID: Sean Price, male    DOB: 1956/10/23, 65 y.o.   MRN: [id number]  HPI: Sean Price is a 65 y.o. male presenting on 09/27/2021 for Medical Management of Chronic Issues, Diabetes, and Hyperlipidemia   HPI Type 2 diabetes mellitus Patient comes in today for recheck of his diabetes. Patient has been currently taking glipizide and Jardiance, A1c looks good at 7.0. Patient is currently on an ACE inhibitor/ARB. Patient has seen an ophthalmologist this year. Patient denies any issues with their feet. The symptom started onset as an adult hypertension and hyperlipidemia and depression ARE RELATED TO DM   Hypertension Patient is currently on lisinopril hydrochlorothiazide, and their blood pressure today is 135/79. Patient denies any lightheadedness or dizziness. Patient denies headaches, blurred vision, chest pains, shortness of breath, or weakness. Denies any side effects from medication and is content with current medication.   Hyperlipidemia Patient is coming in for recheck of his hyperlipidemia. The patient is currently taking Crestor. They deny any issues with myalgias or history of liver damage from it. They deny any focal numbness or weakness or chest pain.   Depression recheck Patient is currently taking Wellbutrin and Paxil for depression.  He feels like he has been a lot more anxious and has not been doing as well since he retired and he does feel like he denies any suicidal issues he is struggling a little bit more recently.  He would like to discuss possible changes.  He says he has tried increasing Wellbutrin previously but has been quite sometime ago. Depression screen [facility] 2/9 09/27/2021 06/24/2021 03/12/2021 12/11/2020 09/09/2020  Decreased Interest 2 1 0 0 0  Down, Depressed, Hopeless 1 1 0 0 0  PHQ - 2 Score 3 2 0 0 0  Altered sleeping 3 1 - - -   Tired, decreased energy 2 0 - - -  Change in appetite 0 0 - - -  Feeling bad or failure about yourself  0 0 - - -  Trouble concentrating 2 1 - - -  Moving slowly or fidgety/restless 2 0 - - -  Suicidal thoughts 0 0 - - -  PHQ-9 Score 12 4 - - -  Difficult doing work/chores - Not difficult at all - - -     Relevant past medical, surgical, family and social history reviewed and updated as indicated. Interim medical history since our last visit reviewed. Allergies and medications reviewed and updated.  Review of Systems  Constitutional:  Negative for chills and fever.  Eyes:  Negative for visual disturbance.  Respiratory:  Negative for shortness of breath and wheezing.   Cardiovascular:  Negative for chest pain and leg swelling.  Musculoskeletal:  Negative for back pain and gait problem.  Skin:  Negative for rash.  Neurological:  Negative for dizziness, weakness and numbness.  All other systems reviewed and are negative.  Per HPI unless specifically indicated above   Allergies as of 09/27/2021   No Known Allergies      Medication List        Accurate as of September 27, 2021  1:52 PM. If you have any questions, ask your nurse or doctor.          buPROPion 300 MG 24 hr tablet Commonly known as: WELLBUTRIN XL  Take 1 tablet (300 mg total) by mouth daily. TAKE 1 TABLET BY MOUTH ONCE DAILY What changed:  medication strength how much to take how to take this when to take this Changed by: Sean Price, Sean Price   empagliflozin 25 MG Tabs tablet Commonly known as: Jardiance Take 1 tablet (25 mg total) by mouth daily before breakfast.   glipiZIDE 5 MG tablet Commonly known as: GLUCOTROL Take 1 tablet (5 mg total) by mouth 2 (two) times daily before a meal.   lisinopril-hydrochlorothiazide 20-25 MG tablet Commonly known as: ZESTORETIC Take 0.5 tablets by mouth daily.   PARoxetine 20 MG tablet Commonly known as: PAXIL Take 1 tablet (20 mg total) by mouth daily.    rosuvastatin 10 MG tablet Commonly known as: Crestor Take 1 tablet (10 mg total) by mouth daily.         Objective:   BP 135/79   Pulse 79   Ht 5\' 10"  (1.778 m)   Wt 194 lb (88 kg)   SpO2 98%   BMI 27.84 kg/m   Wt Readings from Last 3 Encounters:  09/27/21 194 lb (88 kg)  06/24/21 186 lb (84.4 kg)  03/12/21 187 lb (84.8 kg)    Physical Exam Vitals and nursing note reviewed.  Constitutional:      General: He is not in acute distress.    Appearance: He is well-developed. He is not diaphoretic.  Eyes:     General: No scleral icterus.    Conjunctiva/sclera: Conjunctivae normal.  Neck:     Thyroid: No thyromegaly.  Cardiovascular:     Rate and Rhythm: Normal rate and regular rhythm.     Heart sounds: Normal heart sounds. No murmur heard. Pulmonary:     Effort: Pulmonary effort is normal. No respiratory distress.     Breath sounds: Normal breath sounds. No wheezing.  Musculoskeletal:        General: No swelling. Normal range of motion.     Cervical back: Neck supple.  Lymphadenopathy:     Cervical: No cervical adenopathy.  Skin:    General: Skin is warm and dry.     Findings: No rash.  Neurological:     Mental Status: He is alert and oriented to person, place, and time.     Coordination: Coordination normal.  Psychiatric:        Behavior: Behavior normal.      Assessment & Plan:   Problem List Items Addressed This Visit       Cardiovascular and Mediastinum   Hypertension associated with diabetes ([place])     Endocrine   Type 2 diabetes mellitus without complications ([place]) - Primary   Relevant Orders   Bayer DCA Hb A1c Waived   Hyperlipidemia associated with type 2 diabetes mellitus (HCC)     Other   Depression, major, single episode, mild (HCC)   Relevant Medications   buPROPion (WELLBUTRIN XL) 300 MG 24 hr tablet    A1c 7.0, continue current medicine for diabetes and blood pressure.  Patient doing not as well with anxiety, will increase  Wellbutrin to 300 mg and see how he does. Follow up plan: Return in about 3 months (around 12/28/2021), or if symptoms worsen or fail to improve, for Diabetes and depression.  Counseling provided for all of the vaccine components Orders Placed This Encounter  Procedures   Bayer [place] Hb A1c [place] Sean Detter, Sean Price [facility] 09/27/2021, 1:52 PM

## 2021-12-30 ENCOUNTER — Encounter: Payer: Self-pay | Admitting: Family Medicine

## 2021-12-30 ENCOUNTER — Ambulatory Visit (INDEPENDENT_AMBULATORY_CARE_PROVIDER_SITE_OTHER): Payer: Self-pay | Admitting: Family Medicine

## 2021-12-30 VITALS — BP 125/80 | HR 78 | Ht 70.0 in | Wt 197.0 lb

## 2021-12-30 DIAGNOSIS — I152 Hypertension secondary to endocrine disorders: Secondary | ICD-10-CM

## 2021-12-30 DIAGNOSIS — F320 Major depressive disorder, single episode, mild: Secondary | ICD-10-CM

## 2021-12-30 DIAGNOSIS — E1169 Type 2 diabetes mellitus with other specified complication: Secondary | ICD-10-CM

## 2021-12-30 DIAGNOSIS — E119 Type 2 diabetes mellitus without complications: Secondary | ICD-10-CM

## 2021-12-30 DIAGNOSIS — E785 Hyperlipidemia, unspecified: Secondary | ICD-10-CM

## 2021-12-30 DIAGNOSIS — E1159 Type 2 diabetes mellitus with other circulatory complications: Secondary | ICD-10-CM

## 2021-12-30 LAB — BAYER DCA HB A1C WAIVED: HB A1C (BAYER DCA - WAIVED): 8.1 % — ABNORMAL HIGH (ref 4.8–5.6)

## 2021-12-30 MED ORDER — ROSUVASTATIN CALCIUM 10 MG PO TABS: 10.0000 mg | ORAL_TABLET | Freq: Every day | ORAL | 3 refills | Status: DC

## 2021-12-30 MED ORDER — GLIPIZIDE 5 MG PO TABS: 5.0000 mg | ORAL_TABLET | Freq: Two times a day (BID) | ORAL | 3 refills | Status: DC

## 2021-12-30 MED ORDER — LISINOPRIL-HYDROCHLOROTHIAZIDE 20-25 MG PO TABS: 0.5000 | ORAL_TABLET | Freq: Every day | ORAL | 3 refills | Status: DC

## 2021-12-30 MED ORDER — EMPAGLIFLOZIN 25 MG PO TABS: 25.0000 mg | ORAL_TABLET | Freq: Every day | ORAL | 3 refills | Status: DC

## 2021-12-30 MED ORDER — METFORMIN HCL 500 MG PO TABS
500.0000 mg | ORAL_TABLET | Freq: Two times a day (BID) | ORAL | 3 refills | Status: DC
Start: 2021-12-30 — End: 2023-04-14

## 2021-12-30 MED ORDER — PAROXETINE HCL 20 MG PO TABS: 20.0000 mg | ORAL_TABLET | Freq: Every day | ORAL | 3 refills | Status: DC

## 2021-12-30 NOTE — Progress Notes (Signed)
? ?BP 125/80   Pulse 78   Ht '5\' 10"'  (1.778 m)   Wt 197 lb (89.4 kg)   SpO2 98%   BMI 28.27 kg/m?   ? ?Subjective:  ? ?Patient ID: Sean Price, male    DOB: [birth year], 66 y.o.   MRN: [id number] ? ?HPI: ?Sean Price is a 66 y.o. male presenting on 12/30/2021 for Medical Management of Chronic Issues and Diabetes ? ? ?HPI ?Type 2 diabetes mellitus ?Patient comes in today for recheck of his diabetes. Patient has been currently taking Jardiance and glipizide, A1c is up at 8.1. Patient is currently on an ACE inhibitor/ARB. Patient has not seen an ophthalmologist this year. Patient denies any issues with their feet. The symptom started onset as an adult hypertension and hyperlipidemia ARE RELATED TO DM.  He does admit to his diet being up and snacking more and eating more carbohydrates especially cake. ? ?Hypertension ?Patient is currently on lisinopril hydrochlorothiazide, and their blood pressure today is 125/80. Patient denies any lightheadedness or dizziness. Patient denies headaches, blurred vision, chest pains, shortness of breath, or weakness. Denies any side effects from medication and is content with current medication.  ? ?Hyperlipidemia ?Patient is coming in for recheck of his hyperlipidemia. The patient is currently taking Crestor. They deny any issues with myalgias or history of liver damage from it. They deny any focal numbness or weakness or chest pain.  ? ?Depression ?Patient is in for depression recheck and feels like the Paxil and Wellbutrin are doing well.  Denies any major issues.  He says his wife is helping him with a medication box which is helped him be more consistent on his medicines. ? ?Relevant past medical, surgical, family and social history reviewed and updated as indicated. Interim medical history since our last visit reviewed. ?Allergies and medications reviewed and updated. ? ?Review of Systems  ?Constitutional:  Negative for chills and fever.  ?Eyes:  Negative for visual  disturbance.  ?Respiratory:  Negative for shortness of breath and wheezing.   ?Cardiovascular:  Negative for chest pain and leg swelling.  ?Musculoskeletal:  Negative for back pain and gait problem.  ?Skin:  Negative for rash.  ?Neurological:  Negative for dizziness, weakness and light-headedness.  ?All other systems reviewed and are negative. ? ?Per HPI unless specifically indicated above ? ? ?Allergies as of 12/30/2021   ?No Known Allergies ?  ? ?  ?Medication List  ?  ? ?  ? Accurate as of December 30, 2021  8:53 AM. If you have any questions, ask your nurse or doctor.  ?  ?  ? ?  ? ?buPROPion 300 MG 24 hr tablet ?Commonly known as: WELLBUTRIN XL ?Take 1 tablet (300 mg total) by mouth daily. TAKE 1 TABLET BY MOUTH ONCE DAILY ?  ?empagliflozin 25 MG Tabs tablet ?Commonly known as: Jardiance ?Take 1 tablet (25 mg total) by mouth daily before breakfast. ?  ?glipiZIDE 5 MG tablet ?Commonly known as: GLUCOTROL ?Take 1 tablet (5 mg total) by mouth 2 (two) times daily before a meal. ?  ?lisinopril-hydrochlorothiazide 20-25 MG tablet ?Commonly known as: ZESTORETIC ?Take 0.5 tablets by mouth daily. ?  ?metFORMIN 500 MG tablet ?Commonly known as: GLUCOPHAGE ?Take 1 tablet (500 mg total) by mouth 2 (two) times daily with a meal. ?Started by: Worthy Rancher, MD ?  ?PARoxetine 20 MG tablet ?Commonly known as: PAXIL ?Take 1 tablet (20 mg total) by mouth daily. ?  ?rosuvastatin 10 MG tablet ?Commonly known as:  Crestor ?Take 1 tablet (10 mg total) by mouth daily. ?  ? ?  ? ? ? ?Objective:  ? ?BP 125/80   Pulse 78   Ht '5\' 10"'  (1.778 m)   Wt 197 lb (89.4 kg)   SpO2 98%   BMI 28.27 kg/m?   ?Wt Readings from Last 3 Encounters:  ?12/30/21 197 lb (89.4 kg)  ?09/27/21 194 lb (88 kg)  ?06/24/21 186 lb (84.4 kg)  ?  ?Physical Exam ?Vitals and nursing note reviewed.  ?Constitutional:   ?   General: He is not in acute distress. ?   Appearance: He is well-developed. He is not diaphoretic.  ?Eyes:  ?   General: No scleral icterus. ?    Conjunctiva/sclera: Conjunctivae normal.  ?Neck:  ?   Thyroid: No thyromegaly.  ?Cardiovascular:  ?   Rate and Rhythm: Normal rate and regular rhythm.  ?   Heart sounds: Normal heart sounds. No murmur heard. ?Pulmonary:  ?   Effort: Pulmonary effort is normal. No respiratory distress.  ?   Breath sounds: Normal breath sounds. No wheezing.  ?Musculoskeletal:     ?   General: No swelling. Normal range of motion.  ?   Cervical back: Neck supple.  ?Lymphadenopathy:  ?   Cervical: No cervical adenopathy.  ?Skin: ?   General: Skin is warm and dry.  ?   Findings: No rash.  ?Neurological:  ?   Mental Status: He is alert and oriented to person, place, and time.  ?   Coordination: Coordination normal.  ?Psychiatric:     ?   Behavior: Behavior normal.  ? ? ? ? ?Assessment & Plan:  ? ?Problem List Items Addressed This Visit   ? ?  ? Cardiovascular and Mediastinum  ? Hypertension associated with diabetes ([place])  ? Relevant Medications  ? lisinopril-hydrochlorothiazide (ZESTORETIC) 20-25 MG tablet  ? glipiZIDE (GLUCOTROL) 5 MG tablet  ? empagliflozin (JARDIANCE) 25 MG TABS tablet  ? rosuvastatin (CRESTOR) 10 MG tablet  ? metFORMIN (GLUCOPHAGE) 500 MG tablet  ? Other Relevant Orders  ? CBC with Differential/Platelet  ? CMP14+EGFR  ? Lipid panel  ? Bayer DCA Hb A1c Waived  ?  ? Endocrine  ? Type 2 diabetes mellitus without complications ([place]) - Primary  ? Relevant Medications  ? lisinopril-hydrochlorothiazide (ZESTORETIC) 20-25 MG tablet  ? glipiZIDE (GLUCOTROL) 5 MG tablet  ? empagliflozin (JARDIANCE) 25 MG TABS tablet  ? rosuvastatin (CRESTOR) 10 MG tablet  ? metFORMIN (GLUCOPHAGE) 500 MG tablet  ? Other Relevant Orders  ? CBC with Differential/Platelet  ? CMP14+EGFR  ? Lipid panel  ? Bayer DCA Hb A1c Waived  ? Hyperlipidemia associated with type 2 diabetes mellitus ([place])  ? Relevant Medications  ? lisinopril-hydrochlorothiazide (ZESTORETIC) 20-25 MG tablet  ? glipiZIDE (GLUCOTROL) 5 MG tablet  ? empagliflozin (JARDIANCE) 25 MG  TABS tablet  ? rosuvastatin (CRESTOR) 10 MG tablet  ? metFORMIN (GLUCOPHAGE) 500 MG tablet  ? Other Relevant Orders  ? CBC with Differential/Platelet  ? CMP14+EGFR  ? Lipid panel  ? Bayer DCA Hb A1c Waived  ?  ? Other  ? Depression, major, single episode, mild ([place])  ? Relevant Medications  ? PARoxetine (PAXIL) 20 MG tablet  ?  ?A1c is 8.1, will add metformin, also discussed dietary changes.  Continue other medicine.  Blood pressure looks great. ?Follow up plan: ?Return in about 3 months (around 04/01/2022), or if symptoms worsen or fail to improve, for Diabetes and hypertension and depression. ? ?Counseling provided  for all of the vaccine components ?Orders Placed This Encounter  ?Procedures  ? CBC with Differential/Platelet  ? CMP14+EGFR  ? Lipid panel  ? Bayer DCA Hb A1c Waived  ? ? ?Caryl Pina, MD ?[place] ?12/30/2021, 8:53 AM ? ? ? ? ?

## 2021-12-31 LAB — CBC WITH DIFFERENTIAL/PLATELET
Basophils Absolute: 0.1 10*3/uL (ref 0.0–0.2)
Basos: 1 %
EOS (ABSOLUTE): 0.4 10*3/uL (ref 0.0–0.4)
Eos: 5 %
Hematocrit: 42.9 % (ref 37.5–51.0)
Hemoglobin: 14.5 g/dL (ref 13.0–17.7)
Immature Grans (Abs): 0 10*3/uL (ref 0.0–0.1)
Immature Granulocytes: 1 %
Lymphocytes Absolute: 2.2 10*3/uL (ref 0.7–3.1)
Lymphs: 29 %
MCH: 30.6 pg (ref 26.6–33.0)
MCHC: 33.8 g/dL (ref 31.5–35.7)
MCV: 91 fL (ref 79–97)
Monocytes Absolute: 0.8 10*3/uL (ref 0.1–0.9)
Monocytes: 11 %
Neutrophils Absolute: 4 10*3/uL (ref 1.4–7.0)
Neutrophils: 53 %
Platelets: 249 10*3/uL (ref 150–450)
RBC: 4.74 x10E6/uL (ref 4.14–5.80)
RDW: 12.7 % (ref 11.6–15.4)
WBC: 7.4 10*3/uL (ref 3.4–10.8)

## 2021-12-31 LAB — LIPID PANEL
Chol/HDL Ratio: 3.3 ratio (ref 0.0–5.0)
Cholesterol, Total: 140 mg/dL (ref 100–199)
HDL: 42 mg/dL (ref >39)
LDL Chol Calc (NIH): 57 mg/dL (ref 0–99)
Triglycerides: 258 mg/dL — ABNORMAL HIGH (ref 0–149)
VLDL Cholesterol Cal: 41 mg/dL — ABNORMAL HIGH (ref 5–40)

## 2021-12-31 LAB — CMP14+EGFR
ALT: 16 IU/L (ref 0–44)
AST: 17 IU/L (ref 0–40)
Albumin/Globulin Ratio: 2.7 — ABNORMAL HIGH (ref 1.2–2.2)
Albumin: 4.6 g/dL (ref 3.8–4.8)
Alkaline Phosphatase: 114 IU/L (ref 44–121)
BUN/Creatinine Ratio: 23 (ref 10–24)
BUN: 23 mg/dL (ref 8–27)
Bilirubin Total: <0.2 mg/dL (ref 0.0–1.2)
CO2: 25 mmol/L (ref 20–29)
Calcium: 9.6 mg/dL (ref 8.6–10.2)
Chloride: 103 mmol/L (ref 96–106)
Creatinine, Ser: 1 mg/dL (ref 0.76–1.27)
Globulin, Total: 1.7 g/dL (ref 1.5–4.5)
Glucose: 237 mg/dL — ABNORMAL HIGH (ref 70–99)
Potassium: 4.7 mmol/L (ref 3.5–5.2)
Sodium: 140 mmol/L (ref 134–144)
Total Protein: 6.3 g/dL (ref 6.0–8.5)
eGFR: 84 mL/min/{1.73_m2} (ref >59)

## 2022-01-06 NOTE — Progress Notes (Signed)
Patient returning call about labs. Please call back  ?

## 2022-02-03 DIAGNOSIS — H5213 Myopia, bilateral: Secondary | ICD-10-CM | POA: Diagnosis not present

## 2022-02-03 DIAGNOSIS — H524 Presbyopia: Secondary | ICD-10-CM | POA: Diagnosis not present

## 2022-02-03 DIAGNOSIS — H2513 Age-related nuclear cataract, bilateral: Secondary | ICD-10-CM | POA: Diagnosis not present

## 2022-02-03 DIAGNOSIS — H25043 Posterior subcapsular polar age-related cataract, bilateral: Secondary | ICD-10-CM | POA: Diagnosis not present

## 2022-02-03 DIAGNOSIS — E1136 Type 2 diabetes mellitus with diabetic cataract: Secondary | ICD-10-CM | POA: Diagnosis not present

## 2022-02-03 DIAGNOSIS — H52203 Unspecified astigmatism, bilateral: Secondary | ICD-10-CM | POA: Diagnosis not present

## 2022-03-11 ENCOUNTER — Encounter: Payer: Self-pay | Admitting: Nurse Practitioner

## 2022-03-11 ENCOUNTER — Ambulatory Visit (INDEPENDENT_AMBULATORY_CARE_PROVIDER_SITE_OTHER): Payer: No Typology Code available for payment source

## 2022-03-11 ENCOUNTER — Ambulatory Visit (INDEPENDENT_AMBULATORY_CARE_PROVIDER_SITE_OTHER): Payer: No Typology Code available for payment source | Admitting: Nurse Practitioner

## 2022-03-11 VITALS — BP 152/103 | HR 84 | Ht 70.0 in | Wt 189.0 lb

## 2022-03-11 DIAGNOSIS — M79672 Pain in left foot: Secondary | ICD-10-CM

## 2022-03-11 MED ORDER — METHYLPREDNISOLONE ACETATE 40 MG/ML IJ SUSP
40.0000 mg | Freq: Once | INTRAMUSCULAR | Status: AC
  Administered 2022-03-11: 40 mg via INTRAMUSCULAR

## 2022-03-11 NOTE — Progress Notes (Signed)
? ?  Acute Office Visit ? ?Subjective:  ? ?  ?Patient ID: Sean Price, male    DOB: 15-Feb-1956, 66 y.o.   MRN: [id number] ? ?Chief Complaint  ?Patient presents with  ? Foot Injury  ?  Left foot pain , numbness for about 1 day now   ? ? ?Foot Injury  ?The incident occurred 3 to 5 days ago. The incident occurred at home. There was no injury mechanism. The pain is present in the left leg and left heel. The pain is at a severity of 7/10. The pain has been Fluctuating since onset. Associated symptoms include numbness and tingling. Pertinent negatives include no loss of motion or loss of sensation. He reports no foreign bodies present. The symptoms are aggravated by movement and palpation. He has tried acetaminophen for the symptoms.  ? ? ?Review of Systems  ?Constitutional: Negative.  Negative for diaphoresis.  ?HENT: Negative.    ?Eyes: Negative.   ?Respiratory: Negative.    ?Cardiovascular: Negative.   ?Neurological:  Positive for tingling and numbness.  ?Psychiatric/Behavioral: Negative.    ?All other systems reviewed and are negative. ? ? ?   ?Objective:  ?  ?BP (!) 152/103   Pulse 84   Ht '5\' 10"'$  (1.778 m)   Wt 189 lb (85.7 kg)   SpO2 97%   BMI 27.12 kg/m?  ? ? ?Physical Exam ?Vitals and nursing note reviewed.  ?Constitutional:   ?   Appearance: Normal appearance.  ?HENT:  ?   Head: Normocephalic.  ?   Right Ear: External ear normal.  ?   Left Ear: External ear normal.  ?   Nose: Nose normal.  ?   Mouth/Throat:  ?   Mouth: Mucous membranes are moist.  ?   Pharynx: Oropharynx is clear.  ?Eyes:  ?   Conjunctiva/sclera: Conjunctivae normal.  ?Cardiovascular:  ?   Rate and Rhythm: Normal rate and regular rhythm.  ?   Pulses: Normal pulses.  ?   Heart sounds: Normal heart sounds.  ?Pulmonary:  ?   Effort: Pulmonary effort is normal.  ?   Breath sounds: Normal breath sounds.  ?Abdominal:  ?   General: Bowel sounds are normal.  ?Musculoskeletal:  ?     Feet: ? ?Feet:  ?   Comments: Tenderness , slight tingling and  numbness  ?Skin: ?   General: Skin is warm.  ?Neurological:  ?   Mental Status: He is alert and oriented to person, place, and time.  ? ? ?No results found for any visits on 03/11/22. ? ? ?   ?Assessment & Plan:  ?Advised patient to rest foot, elevate and soak feet in epsom salt,  ?Tylenol for pain as needed ?-80 Depo-Medrol shot given in clinic ?-Follow-up with worsening unresolved symptoms. ?Problem List Items Addressed This Visit   ?None ?Visit Diagnoses   ? ? Left foot pain    -  Primary  ? Relevant Medications  ? methylPREDNISolone acetate (DEPO-MEDROL) injection 40 mg (Completed)  ? Other Relevant Orders  ? DG Foot Complete Left  ? ?  ? ? ?Meds ordered this encounter  ?Medications  ? methylPREDNISolone acetate (DEPO-MEDROL) injection 40 mg  ? ? ?Return if symptoms worsen or fail to improve. ? ?Ivy Lynn, NP ? ? ?

## 2022-03-11 NOTE — Patient Instructions (Signed)

## 2022-03-14 ENCOUNTER — Other Ambulatory Visit: Payer: Self-pay | Admitting: Nurse Practitioner

## 2022-03-14 DIAGNOSIS — M79672 Pain in left foot: Secondary | ICD-10-CM

## 2022-03-14 NOTE — Progress Notes (Signed)
Sent result to mychart message  ?

## 2022-03-14 NOTE — Telephone Encounter (Signed)
Your resulted just came in. ? ?I have also responded in result note so patient do not receive 2 phone calls ? ? ?Degenerative joint changes of the left foot. Plantar calcaneal spur ?noted. ? ?No fracture or dislocation ? ?Patient recommendation - Extra strength tylenol, foot soak, Podiatry (foot doctor) ? ?Due to patients current medication, I am unable to send antiinflammatory ?

## 2022-04-04 ENCOUNTER — Ambulatory Visit (INDEPENDENT_AMBULATORY_CARE_PROVIDER_SITE_OTHER): Payer: No Typology Code available for payment source | Admitting: Family Medicine

## 2022-04-04 ENCOUNTER — Encounter: Payer: Self-pay | Admitting: Family Medicine

## 2022-04-04 VITALS — BP 135/85 | HR 68 | Temp 97.9°F | Ht 70.0 in | Wt 183.0 lb

## 2022-04-04 DIAGNOSIS — E1159 Type 2 diabetes mellitus with other circulatory complications: Secondary | ICD-10-CM

## 2022-04-04 DIAGNOSIS — E1169 Type 2 diabetes mellitus with other specified complication: Secondary | ICD-10-CM

## 2022-04-04 DIAGNOSIS — E785 Hyperlipidemia, unspecified: Secondary | ICD-10-CM

## 2022-04-04 DIAGNOSIS — G2581 Restless legs syndrome: Secondary | ICD-10-CM | POA: Diagnosis not present

## 2022-04-04 DIAGNOSIS — I152 Hypertension secondary to endocrine disorders: Secondary | ICD-10-CM

## 2022-04-04 DIAGNOSIS — E119 Type 2 diabetes mellitus without complications: Secondary | ICD-10-CM

## 2022-04-04 LAB — BAYER DCA HB A1C WAIVED: HB A1C (BAYER DCA - WAIVED): 6.7 % — ABNORMAL HIGH (ref 4.8–5.6)

## 2022-04-04 MED ORDER — ONETOUCH ULTRASOFT LANCETS MISC: 12 refills | Status: DC

## 2022-04-04 MED ORDER — BLOOD GLUCOSE TEST VI STRP: 1.0000 | ORAL_STRIP | Freq: Two times a day (BID) | 3 refills | Status: DC

## 2022-04-04 MED ORDER — PRAMIPEXOLE DIHYDROCHLORIDE 0.25 MG PO TABS: 0.2500 mg | ORAL_TABLET | Freq: Three times a day (TID) | ORAL | 3 refills | Status: DC

## 2022-04-04 NOTE — Progress Notes (Signed)
BP 135/85   Pulse 68   Temp 97.9 F (36.6 C)   Ht _0  (1.778 m)   Wt 183 lb (83 kg)   SpO2 99%   BMI 26.26 kg/m    Subjective:   Patient ID: Sean Price, male    DOB: [birth year], 66 y.o.   MRN: [id number]  HPI: Sean Price is a 66 y.o. male presenting on 04/04/2022 for Medical Management of Chronic Issues and Diabetes   HPI Type 2 diabetes mellitus Patient comes in today for recheck of his diabetes. Patient has been currently taking metformin and glipizide. Patient is currently on an ACE inhibitor/ARB. Patient has not seen an ophthalmologist this year. Patient denies any issues with their feet. The symptom started onset as an adult htn and hld ARE RELATED TO DM   Hypertension Patient is currently on lisinopril- hctz, and their blood pressure today is 135/85. Patient denies any lightheadedness or dizziness. Patient denies headaches, blurred vision, chest pains, shortness of breath, or weakness. Denies any side effects from medication and is content with current medication.   Hyperlipidemia Patient is coming in for recheck of his hyperlipidemia. The patient is currently taking crestor. They deny any issues with myalgias or history of liver damage from it. They deny any focal numbness or weakness or chest pain.   Patient is coming in for restless legs.  He says he has a hard time with that is getting worse and not getting better.  He says is every evening.  He is try to get exercise more his legs are just not improving and would like to try medicine for  Relevant past medical, surgical, family and social history reviewed and updated as indicated. Interim medical history since our last visit reviewed. Allergies and medications reviewed and updated.  Review of Systems  Constitutional:  Negative for chills and fever.  Eyes:  Negative for visual disturbance.  Respiratory:  Negative for shortness of breath and wheezing.   Cardiovascular:  Negative for chest pain and leg  swelling.  Musculoskeletal:  Negative for back pain and gait problem.  Skin:  Negative for rash.  Psychiatric/Behavioral:  Positive for sleep disturbance. Negative for dysphoric mood. The patient is not nervous/anxious.   All other systems reviewed and are negative.  Per HPI unless specifically indicated above   Allergies as of 04/04/2022   No Known Allergies      Medication List        Accurate as of April 04, 2022  9:20 AM. If you have any questions, ask your nurse or doctor.          BLOOD GLUCOSE TEST STRIPS Strp 1 strip by In Vitro route 2 (two) times daily. Started by: Fransisca Kaufmann Royal Beirne, MD   buPROPion 300 MG 24 hr tablet Commonly known as: WELLBUTRIN XL Take 1 tablet (300 mg total) by mouth daily. TAKE 1 TABLET BY MOUTH ONCE DAILY   D3-1000 PO Take by mouth daily.   glipiZIDE 5 MG tablet Commonly known as: GLUCOTROL Take 1 tablet (5 mg total) by mouth 2 (two) times daily before a meal.   glucose 5 g chewable tablet Chew 15 g by mouth as needed for low blood sugar.   lisinopril-hydrochlorothiazide 20-25 MG tablet Commonly known as: ZESTORETIC Take 0.5 tablets by mouth daily.   Melatonin 5 MG Caps Take by mouth at bedtime.   metFORMIN 500 MG tablet Commonly known as: GLUCOPHAGE Take 1 tablet (500 mg total) by mouth 2 (two) times  daily with a meal.   onetouch ultrasoft lancets Use as instructed Started by: Fransisca Kaufmann Mallisa [place], MD   PARoxetine 20 MG tablet Commonly known as: PAXIL Take 1 tablet (20 mg total) by mouth daily.   pramipexole 0.25 MG tablet Commonly known as: Mirapex Take 1 tablet (0.25 mg total) by mouth 3 (three) times daily. Started by: Worthy Rancher, MD   rosuvastatin 10 MG tablet Commonly known as: Crestor Take 1 tablet (10 mg total) by mouth daily.         Objective:   BP 135/85   Pulse 68   Temp 97.9 F (36.6 C)   Ht _0  (1.778 m)   Wt 183 lb (83 kg)   SpO2 99%   BMI 26.26 kg/m   Wt Readings from Last 3  Encounters:  04/04/22 183 lb (83 kg)  03/11/22 189 lb (85.7 kg)  12/30/21 197 lb (89.4 kg)    Physical Exam Vitals and nursing note reviewed.  Constitutional:      General: He is not in acute distress.    Appearance: He is well-developed. He is not diaphoretic.  Eyes:     General: No scleral icterus.    Conjunctiva/sclera: Conjunctivae normal.  Neck:     Thyroid: No thyromegaly.  Cardiovascular:     Rate and Rhythm: Normal rate and regular rhythm.     Heart sounds: Normal heart sounds. No murmur heard. Pulmonary:     Effort: Pulmonary effort is normal. No respiratory distress.     Breath sounds: Normal breath sounds. No wheezing.  Musculoskeletal:        General: No swelling. Normal range of motion.     Cervical back: Neck supple.  Lymphadenopathy:     Cervical: No cervical adenopathy.  Skin:    General: Skin is warm and dry.     Findings: No rash.  Neurological:     Mental Status: He is alert and oriented to person, place, and time.     Coordination: Coordination normal.  Psychiatric:        Behavior: Behavior normal.      Assessment & Plan:   Problem List Items Addressed This Visit       Cardiovascular and Mediastinum   Hypertension associated with diabetes ([place])   Relevant Medications   glucose 5 g chewable tablet   Other Relevant Orders   Bayer DCA Hb A1c Waived   CBC with Differential/Platelet   Lipid panel   BMP8+EGFR     Endocrine   Type 2 diabetes mellitus without complications ([facility]) - Primary   Relevant Medications   glucose 5 g chewable tablet   Lancets (ONETOUCH ULTRASOFT) lancets   Glucose Blood (BLOOD GLUCOSE TEST STRIPS) STRP   Other Relevant Orders   Bayer DCA Hb A1c Waived   CBC with Differential/Platelet   Lipid panel   BMP8+EGFR   Hyperlipidemia associated with type 2 diabetes mellitus ([facility])   Relevant Medications   glucose 5 g chewable tablet   Other Relevant Orders   Bayer DCA Hb A1c Waived   CBC with Differential/Platelet    Lipid panel   BMP8+EGFR   Other Visit Diagnoses     RLS (restless legs syndrome)       Relevant Medications   pramipexole (MIRAPEX) 0.25 MG tablet       A1c looks much better at 6.7, continue with diet and exercise.  We will start Mirapex for her restless legs and see how he does with the Follow up plan:  Return in about 3 months (around 07/05/2022), or if symptoms worsen or fail to improve, for Hyperlipidemia and hypertension and diabetes.  Counseling provided for all of the vaccine components Orders Placed This Encounter  Procedures   Bayer [place] Hb A1c Waived   CBC with Differential/Platelet   Lipid panel   BMP8+EGFR    Caryl Pina, MD [facility] 04/04/2022, 9:20 AM

## 2022-04-05 ENCOUNTER — Encounter: Payer: Self-pay | Admitting: Family Medicine

## 2022-04-05 LAB — BMP8+EGFR
BUN/Creatinine Ratio: 19 (ref 10–24)
BUN: 18 mg/dL (ref 8–27)
CO2: 24 mmol/L (ref 20–29)
Calcium: 9.6 mg/dL (ref 8.6–10.2)
Chloride: 105 mmol/L (ref 96–106)
Creatinine, Ser: 0.94 mg/dL (ref 0.76–1.27)
Glucose: 164 mg/dL — ABNORMAL HIGH (ref 70–99)
Potassium: 4.7 mmol/L (ref 3.5–5.2)
Sodium: 140 mmol/L (ref 134–144)
eGFR: 90 mL/min/{1.73_m2} (ref >59)

## 2022-04-05 LAB — CBC WITH DIFFERENTIAL/PLATELET
Basophils Absolute: 0.1 10*3/uL (ref 0.0–0.2)
Basos: 1 %
EOS (ABSOLUTE): 0.3 10*3/uL (ref 0.0–0.4)
Eos: 4 %
Hematocrit: 40 % (ref 37.5–51.0)
Hemoglobin: 13.8 g/dL (ref 13.0–17.7)
Immature Grans (Abs): 0 10*3/uL (ref 0.0–0.1)
Immature Granulocytes: 0 %
Lymphocytes Absolute: 2.4 10*3/uL (ref 0.7–3.1)
Lymphs: 36 %
MCH: 31 pg (ref 26.6–33.0)
MCHC: 34.5 g/dL (ref 31.5–35.7)
MCV: 90 fL (ref 79–97)
Monocytes Absolute: 0.6 10*3/uL (ref 0.1–0.9)
Monocytes: 10 %
Neutrophils Absolute: 3.2 10*3/uL (ref 1.4–7.0)
Neutrophils: 49 %
Platelets: 236 10*3/uL (ref 150–450)
RBC: 4.45 x10E6/uL (ref 4.14–5.80)
RDW: 13.1 % (ref 11.6–15.4)
WBC: 6.5 10*3/uL (ref 3.4–10.8)

## 2022-04-05 LAB — LIPID PANEL
Chol/HDL Ratio: 2.5 ratio (ref 0.0–5.0)
Cholesterol, Total: 132 mg/dL (ref 100–199)
HDL: 53 mg/dL (ref >39)
LDL Chol Calc (NIH): 66 mg/dL (ref 0–99)
Triglycerides: 63 mg/dL (ref 0–149)
VLDL Cholesterol Cal: 13 mg/dL (ref 5–40)

## 2022-04-13 ENCOUNTER — Ambulatory Visit (INDEPENDENT_AMBULATORY_CARE_PROVIDER_SITE_OTHER): Payer: No Typology Code available for payment source | Admitting: Family Medicine

## 2022-04-13 ENCOUNTER — Encounter: Payer: Self-pay | Admitting: Family Medicine

## 2022-04-13 VITALS — BP 131/82 | HR 87 | Temp 98.0°F | Ht 70.0 in | Wt 178.0 lb

## 2022-04-13 DIAGNOSIS — Z77011 Contact with and (suspected) exposure to lead: Secondary | ICD-10-CM

## 2022-04-13 DIAGNOSIS — K219 Gastro-esophageal reflux disease without esophagitis: Secondary | ICD-10-CM | POA: Diagnosis not present

## 2022-04-13 MED ORDER — SUCRALFATE 1 G PO TABS: 1.0000 g | ORAL_TABLET | Freq: Three times a day (TID) | ORAL | 0 refills | Status: DC

## 2022-04-13 MED ORDER — PANTOPRAZOLE SODIUM 40 MG PO TBEC: 40.0000 mg | DELAYED_RELEASE_TABLET | Freq: Every day | ORAL | 3 refills | Status: DC

## 2022-04-13 NOTE — Progress Notes (Signed)
BP 131/82   Pulse 87   Temp 98 F (36.7 C)   Ht '5\' 10"'$  (1.778 m)   Wt 178 lb (80.7 kg)   SpO2 98%   BMI 25.54 kg/m    Subjective:   Patient ID: Sean Price, male    DOB: 07/27/1956, 66 y.o.   MRN: [id number]  HPI: KAEL KEETCH is a 66 y.o. male presenting on 04/13/2022 for Dizziness (Has improved with OTC motion sickness medication. Believes related to vision/glasses), Abdominal Pain (Generalized. Concerned about reflux or ulcer), and Lead Poisoning (Lives in older home and wants to have labs)   HPI Patient is coming in complaining of dizziness and feeling sick to his stomach sometimes nauseated and sometimes motion sickness.  He says he is complaining of abdominal pain and dry heaving and acid reflux.  Sometimes will get better after eating eggs or after eating something more proteinaceous but then will be worse in the morning.  He has been dealing with this off and on for at least the past month or 2.  He is also concerned with the dizziness and staff because he is pulled down some of his ceiling and they have 65 year old house and he was working on some areas that were previously painted a long time ago and covered up and he is concerned about possible lead paint.  He has been taking a lot of Pepto-Bismol and Dramamine to help and they have been helping.  He denies any vomiting but does have the dry heaving.  He denies any blood in his stool or blood in his vomit or dry heave.  Relevant past medical, surgical, family and social history reviewed and updated as indicated. Interim medical history since our last visit reviewed. Allergies and medications reviewed and updated.  Review of Systems  Constitutional:  Negative for chills and fever.  Eyes:  Negative for visual disturbance.  Respiratory:  Negative for shortness of breath and wheezing.   Cardiovascular:  Negative for chest pain and leg swelling.  Gastrointestinal:  Positive for abdominal pain, diarrhea and nausea.  Negative for abdominal distention, constipation and vomiting.  Musculoskeletal:  Negative for back pain and gait problem.  Skin:  Negative for rash.  Neurological:  Positive for dizziness. Negative for light-headedness.  All other systems reviewed and are negative.   Per HPI unless specifically indicated above   Allergies as of 04/13/2022   No Known Allergies      Medication List        Accurate as of April 13, 2022  9:13 AM. If you have any questions, ask your nurse or doctor.          bismuth subsalicylate 354 SF/68LE suspension Commonly known as: PEPTO BISMOL Take 30 mLs by mouth every 6 (six) hours as needed.   BLOOD GLUCOSE TEST STRIPS Strp 1 strip by In Vitro route 2 (two) times daily.   buPROPion 300 MG 24 hr tablet Commonly known as: WELLBUTRIN XL Take 1 tablet (300 mg total) by mouth daily. TAKE 1 TABLET BY MOUTH ONCE DAILY   D3-1000 PO Take by mouth daily.   dimenhyDRINATE 50 MG tablet Commonly known as: DRAMAMINE Take 50 mg by mouth every 8 (eight) hours as needed.   glipiZIDE 5 MG tablet Commonly known as: GLUCOTROL Take 1 tablet (5 mg total) by mouth 2 (two) times daily before a meal.   glucose 5 g chewable tablet Chew 15 g by mouth as needed for low blood sugar.   lisinopril-hydrochlorothiazide  20-25 MG tablet Commonly known as: ZESTORETIC Take 0.5 tablets by mouth daily.   Melatonin 5 MG Caps Take by mouth at bedtime.   metFORMIN 500 MG tablet Commonly known as: GLUCOPHAGE Take 1 tablet (500 mg total) by mouth 2 (two) times daily with a meal.   onetouch ultrasoft lancets Use as instructed   pantoprazole 40 MG tablet Commonly known as: PROTONIX Take 1 tablet (40 mg total) by mouth daily. Started by: Worthy Rancher, MD   PARoxetine 20 MG tablet Commonly known as: PAXIL Take 1 tablet (20 mg total) by mouth daily.   pramipexole 0.25 MG tablet Commonly known as: Mirapex Take 1 tablet (0.25 mg total) by mouth 3 (three) times  daily.   rosuvastatin 10 MG tablet Commonly known as: Crestor Take 1 tablet (10 mg total) by mouth daily.   sucralfate 1 g tablet Commonly known as: Carafate Take 1 tablet (1 g total) by mouth 4 (four) times daily -  with meals and at bedtime. Started by: Fransisca Kaufmann Guida Asman, MD   vitamin B-12 500 MCG tablet Commonly known as: CYANOCOBALAMIN Take 500 mcg by mouth daily.   vitamin C 100 MG tablet Take 100 mg by mouth daily.         Objective:   BP 131/82   Pulse 87   Temp 98 F (36.7 C)   Ht '5\' 10"'$  (1.778 m)   Wt 178 lb (80.7 kg)   SpO2 98%   BMI 25.54 kg/m   Wt Readings from Last 3 Encounters:  04/13/22 178 lb (80.7 kg)  04/04/22 183 lb (83 kg)  03/11/22 189 lb (85.7 kg)    Physical Exam Vitals and nursing note reviewed.  Constitutional:      General: He is not in acute distress.    Appearance: He is well-developed. He is not diaphoretic.  Eyes:     General: No scleral icterus.    Conjunctiva/sclera: Conjunctivae normal.  Neck:     Thyroid: No thyromegaly.  Cardiovascular:     Rate and Rhythm: Normal rate and regular rhythm.     Heart sounds: Normal heart sounds. No murmur heard. Pulmonary:     Effort: Pulmonary effort is normal. No respiratory distress.     Breath sounds: Normal breath sounds. No wheezing.  Abdominal:     General: Abdomen is flat. Bowel sounds are normal. There is no distension.     Tenderness: There is abdominal tenderness. There is no right CVA tenderness, left CVA tenderness, guarding or rebound.  Musculoskeletal:        General: Normal range of motion.     Cervical back: Neck supple.  Lymphadenopathy:     Cervical: No cervical adenopathy.  Skin:    General: Skin is warm and dry.     Findings: No rash.  Neurological:     Mental Status: He is alert and oriented to person, place, and time.     Coordination: Coordination normal.  Psychiatric:        Behavior: Behavior normal.       Assessment & Plan:   Problem List Items  Addressed This Visit   None Visit Diagnoses     Gastroesophageal reflux disease without esophagitis    -  Primary   Relevant Medications   dimenhyDRINATE (DRAMAMINE) 50 MG tablet   bismuth subsalicylate (PEPTO BISMOL) 262 MG/15ML suspension   sucralfate (CARAFATE) 1 g tablet   pantoprazole (PROTONIX) 40 MG tablet   Other Relevant Orders   CBC with Differential/Platelet  Lead exposure       Relevant Orders   CBC with Differential/Platelet   Lead, blood       Patient has epigastric abdominal tenderness and nausea consistent with GERD, will send Protonix and Carafate for him to calm it down and will do some blood work today to make sure he is not losing any blood.  We will also test for lead because he is working on his 65 year old house concern for leg pain. Follow up plan: Return if symptoms worsen or fail to improve.  Counseling provided for all of the vaccine components Orders Placed This Encounter  Procedures   CBC with Differential/Platelet   Lead, blood    Caryl Pina, MD [facility] 04/13/2022, 9:13 AM

## 2022-04-15 LAB — CBC WITH DIFFERENTIAL/PLATELET
Basophils Absolute: 0.1 10*3/uL (ref 0.0–0.2)
Basos: 1 %
EOS (ABSOLUTE): 0.2 10*3/uL (ref 0.0–0.4)
Eos: 3 %
Hematocrit: 44.1 % (ref 37.5–51.0)
Hemoglobin: 15.6 g/dL (ref 13.0–17.7)
Immature Grans (Abs): 0 10*3/uL (ref 0.0–0.1)
Immature Granulocytes: 0 %
Lymphocytes Absolute: 2.3 10*3/uL (ref 0.7–3.1)
Lymphs: 30 %
MCH: 31.5 pg (ref 26.6–33.0)
MCHC: 35.4 g/dL (ref 31.5–35.7)
MCV: 89 fL (ref 79–97)
Monocytes Absolute: 0.9 10*3/uL (ref 0.1–0.9)
Monocytes: 11 %
Neutrophils Absolute: 4.3 10*3/uL (ref 1.4–7.0)
Neutrophils: 55 %
Platelets: 300 10*3/uL (ref 150–450)
RBC: 4.96 x10E6/uL (ref 4.14–5.80)
RDW: 13.7 % (ref 11.6–15.4)
WBC: 7.9 10*3/uL (ref 3.4–10.8)

## 2022-04-15 LAB — LEAD, BLOOD (ADULT >= 16 YRS): Lead-Whole Blood: 6.9 ug/dL — ABNORMAL HIGH (ref 0.0–3.4)

## 2022-04-26 ENCOUNTER — Other Ambulatory Visit: Payer: Self-pay | Admitting: Family Medicine

## 2022-04-26 DIAGNOSIS — K219 Gastro-esophageal reflux disease without esophagitis: Secondary | ICD-10-CM

## 2022-04-28 ENCOUNTER — Other Ambulatory Visit: Payer: Self-pay | Admitting: *Deleted

## 2022-04-28 DIAGNOSIS — K219 Gastro-esophageal reflux disease without esophagitis: Secondary | ICD-10-CM

## 2022-04-28 MED ORDER — SUCRALFATE 1 G PO TABS: ORAL_TABLET | ORAL | 3 refills | Status: DC

## 2022-05-14 ENCOUNTER — Other Ambulatory Visit: Payer: Self-pay | Admitting: Family Medicine

## 2022-05-14 DIAGNOSIS — K219 Gastro-esophageal reflux disease without esophagitis: Secondary | ICD-10-CM

## 2022-05-25 DIAGNOSIS — H25812 Combined forms of age-related cataract, left eye: Secondary | ICD-10-CM | POA: Diagnosis not present

## 2022-05-25 DIAGNOSIS — H2512 Age-related nuclear cataract, left eye: Secondary | ICD-10-CM | POA: Diagnosis not present

## 2022-05-25 DIAGNOSIS — H25041 Posterior subcapsular polar age-related cataract, right eye: Secondary | ICD-10-CM | POA: Diagnosis not present

## 2022-05-25 DIAGNOSIS — H2511 Age-related nuclear cataract, right eye: Secondary | ICD-10-CM | POA: Diagnosis not present

## 2022-06-01 DIAGNOSIS — H25811 Combined forms of age-related cataract, right eye: Secondary | ICD-10-CM | POA: Diagnosis not present

## 2022-06-01 DIAGNOSIS — H2511 Age-related nuclear cataract, right eye: Secondary | ICD-10-CM | POA: Diagnosis not present

## 2022-06-03 DIAGNOSIS — M5442 Lumbago with sciatica, left side: Secondary | ICD-10-CM | POA: Diagnosis not present

## 2022-06-16 DIAGNOSIS — M545 Low back pain, unspecified: Secondary | ICD-10-CM | POA: Diagnosis not present

## 2022-06-16 DIAGNOSIS — M5126 Other intervertebral disc displacement, lumbar region: Secondary | ICD-10-CM | POA: Diagnosis not present

## 2022-07-11 ENCOUNTER — Ambulatory Visit (INDEPENDENT_AMBULATORY_CARE_PROVIDER_SITE_OTHER): Payer: No Typology Code available for payment source | Admitting: Family Medicine

## 2022-07-11 ENCOUNTER — Encounter: Payer: Self-pay | Admitting: Family Medicine

## 2022-07-11 VITALS — BP 119/81 | HR 73 | Ht 70.0 in | Wt 178.0 lb

## 2022-07-11 DIAGNOSIS — E119 Type 2 diabetes mellitus without complications: Secondary | ICD-10-CM

## 2022-07-11 DIAGNOSIS — F320 Major depressive disorder, single episode, mild: Secondary | ICD-10-CM | POA: Diagnosis not present

## 2022-07-11 DIAGNOSIS — E785 Hyperlipidemia, unspecified: Secondary | ICD-10-CM

## 2022-07-11 DIAGNOSIS — E1159 Type 2 diabetes mellitus with other circulatory complications: Secondary | ICD-10-CM

## 2022-07-11 DIAGNOSIS — E1169 Type 2 diabetes mellitus with other specified complication: Secondary | ICD-10-CM | POA: Diagnosis not present

## 2022-07-11 DIAGNOSIS — I152 Hypertension secondary to endocrine disorders: Secondary | ICD-10-CM

## 2022-07-11 DIAGNOSIS — Z77011 Contact with and (suspected) exposure to lead: Secondary | ICD-10-CM | POA: Diagnosis not present

## 2022-07-11 LAB — BAYER DCA HB A1C WAIVED: HB A1C (BAYER DCA - WAIVED): 6.4 % — ABNORMAL HIGH (ref 4.8–5.6)

## 2022-07-11 MED ORDER — BUPROPION HCL ER (XL) 300 MG PO TB24: 300.0000 mg | ORAL_TABLET | Freq: Every day | ORAL | 3 refills | Status: DC

## 2022-07-11 NOTE — Progress Notes (Signed)
BP 119/81   Pulse 73   Ht '5\' 10"'$  (1.778 m)   Wt 178 lb (80.7 kg)   SpO2 99%   BMI 25.54 kg/m    Subjective:   Patient ID: Sean Price, male    DOB: 01-30-1956, 66 y.o.   MRN: [id number]  HPI: Sean Price is a 66 y.o. male presenting on 07/11/2022 for Medical Management of Chronic Issues and Diabetes   HPI Depression recheck Patient is coming for depression recheck.  He is currently taking Wellbutrin and Paxil pramipexole and feels like they are doing well for both his depression and his wife sleeping and restless legs and he feels like he is at a good place right now.    07/11/2022    9:30 AM 04/04/2022    8:34 AM 03/11/2022   10:46 AM 12/30/2021    8:17 AM 09/27/2021    1:10 PM  Depression screen PHQ 2/9  Decreased Interest '1 1 2 1 2  '$ Down, Depressed, Hopeless '2 2 1 2 1  '$ PHQ - 2 Score '3 3 3 3 3  '$ Altered sleeping '2 1 3 3 3  '$ Tired, decreased energy '1 1 2 1 2  '$ Change in appetite '2 1 1 1 '$ 0  Feeling bad or failure about yourself  0 2 0 1 0  Trouble concentrating 0 2 3 0 2  Moving slowly or fidgety/restless 0 '3 2 3 2  '$ Suicidal thoughts 0 1 0 1 0  PHQ-9 Score '8 14 14 13 12  '$ Difficult doing work/chores Not difficult at all Somewhat difficult Somewhat difficult      Type 2 diabetes mellitus Patient comes in today for recheck of his diabetes. Patient has been currently taking metformin and glipizide. Patient is currently on an ACE inhibitor/ARB. Patient has seen an ophthalmologist this year. Patient denies any issues with their feet. The symptom started onset as an adult hypertension and hyperlipidemia ARE RELATED TO DM   Hypertension Patient is currently on lisinopril hydrochlorothiazide, and their blood pressure today is 119/81. Patient denies any lightheadedness or dizziness. Patient denies headaches, blurred vision, chest pains, shortness of breath, or weakness. Denies any side effects from medication and is content with current medication.   Hyperlipidemia Patient  is coming in for recheck of his hyperlipidemia. The patient is currently taking Crestor. They deny any issues with myalgias or history of liver damage from it. They deny any focal numbness or weakness or chest pain.   Relevant past medical, surgical, family and social history reviewed and updated as indicated. Interim medical history since our last visit reviewed. Allergies and medications reviewed and updated.  Review of Systems  Constitutional:  Negative for chills and fever.  Eyes:  Negative for visual disturbance.  Respiratory:  Negative for shortness of breath and wheezing.   Cardiovascular:  Negative for chest pain and leg swelling.  Musculoskeletal:  Negative for back pain and gait problem.  Skin:  Negative for rash.  Neurological:  Negative for dizziness, weakness and light-headedness.  All other systems reviewed and are negative.   Per HPI unless specifically indicated above   Allergies as of 07/11/2022   No Known Allergies      Medication List        Accurate as of July 11, 2022 10:02 AM. If you have any questions, ask your nurse or doctor.          STOP taking these medications    bismuth subsalicylate 174 BS/49QP suspension Commonly known as: PEPTO  BISMOL Stopped by: Fransisca Kaufmann Beretta Ginsberg, MD       TAKE these medications    BLOOD GLUCOSE TEST STRIPS Strp 1 strip by In Vitro route 2 (two) times daily.   buPROPion 300 MG 24 hr tablet Commonly known as: WELLBUTRIN XL Take 1 tablet (300 mg total) by mouth daily. TAKE 1 TABLET BY MOUTH ONCE DAILY   cyanocobalamin 500 MCG tablet Commonly known as: VITAMIN B12 Take 500 mcg by mouth daily.   D3-1000 PO Take by mouth daily.   dimenhyDRINATE 50 MG tablet Commonly known as: DRAMAMINE Take 50 mg by mouth every 8 (eight) hours as needed.   glipiZIDE 5 MG tablet Commonly known as: GLUCOTROL Take 1 tablet (5 mg total) by mouth 2 (two) times daily before a meal.   glucose 5 g chewable tablet Chew 15 g  by mouth as needed for low blood sugar.   lisinopril-hydrochlorothiazide 20-25 MG tablet Commonly known as: ZESTORETIC Take 0.5 tablets by mouth daily.   Melatonin 5 MG Caps Take by mouth at bedtime.   metFORMIN 500 MG tablet Commonly known as: GLUCOPHAGE Take 1 tablet (500 mg total) by mouth 2 (two) times daily with a meal.   onetouch ultrasoft lancets Use as instructed   pantoprazole 40 MG tablet Commonly known as: PROTONIX Take 1 tablet (40 mg total) by mouth daily.   PARoxetine 20 MG tablet Commonly known as: PAXIL Take 1 tablet (20 mg total) by mouth daily.   pramipexole 0.25 MG tablet Commonly known as: Mirapex Take 1 tablet (0.25 mg total) by mouth 3 (three) times daily.   rosuvastatin 10 MG tablet Commonly known as: Crestor Take 1 tablet (10 mg total) by mouth daily.   sucralfate 1 g tablet Commonly known as: CARAFATE TAKE 1 TABLET BY MOUTH 4 TIMES DAILY WITH MEALS AND AT BEDTIME   vitamin C 100 MG tablet Take 100 mg by mouth daily.         Objective:   BP 119/81   Pulse 73   Ht '5\' 10"'$  (1.778 m)   Wt 178 lb (80.7 kg)   SpO2 99%   BMI 25.54 kg/m   Wt Readings from Last 3 Encounters:  07/11/22 178 lb (80.7 kg)  04/13/22 178 lb (80.7 kg)  04/04/22 183 lb (83 kg)    Physical Exam Vitals and nursing note reviewed.  Constitutional:      General: He is not in acute distress.    Appearance: He is well-developed. He is not diaphoretic.  Eyes:     General: No scleral icterus.    Conjunctiva/sclera: Conjunctivae normal.  Neck:     Thyroid: No thyromegaly.  Cardiovascular:     Rate and Rhythm: Normal rate and regular rhythm.     Heart sounds: Normal heart sounds. No murmur heard. Pulmonary:     Effort: Pulmonary effort is normal. No respiratory distress.     Breath sounds: Normal breath sounds. No wheezing.  Musculoskeletal:        General: No swelling. Normal range of motion.     Cervical back: Neck supple.  Lymphadenopathy:     Cervical: No  cervical adenopathy.  Skin:    General: Skin is warm and dry.     Findings: No rash.  Neurological:     Mental Status: He is alert and oriented to person, place, and time.     Coordination: Coordination normal.  Psychiatric:        Behavior: Behavior normal.  Assessment & Plan:   Problem List Items Addressed This Visit       Cardiovascular and Mediastinum   Hypertension associated with diabetes ([place])     Endocrine   Type 2 diabetes mellitus without complications ([place])   Relevant Orders   Bayer DCA Hb A1c Waived   Hyperlipidemia associated with type 2 diabetes mellitus (HCC)     Other   Depression, major, single episode, mild (HCC)   Relevant Medications   buPROPion (WELLBUTRIN XL) 300 MG 24 hr tablet   Other Visit Diagnoses     Lead exposure    -  Primary   Relevant Orders   Lead, blood       A1c looks good at 6.4, blood pressure looks good.  We will check blood work.  No other changes. Follow up plan: Return in about 3 months (around 10/10/2022), or if symptoms worsen or fail to improve, for Diabetes and hypertension and cholesterol and depression.  Counseling provided for all of the vaccine components Orders Placed This Encounter  Procedures   Bayer [place] Hb A1c Waived   Lead, blood    Caryl Pina, MD [facility] 07/11/2022, 10:02 AM

## 2022-07-14 LAB — LEAD, BLOOD (ADULT >= 16 YRS): Lead-Whole Blood: 8.7 ug/dL — ABNORMAL HIGH (ref 0.0–3.4)

## 2022-07-22 DIAGNOSIS — M25562 Pain in left knee: Secondary | ICD-10-CM | POA: Diagnosis not present

## 2022-07-22 DIAGNOSIS — M5442 Lumbago with sciatica, left side: Secondary | ICD-10-CM | POA: Diagnosis not present

## 2022-07-28 ENCOUNTER — Other Ambulatory Visit: Payer: Self-pay | Admitting: Family Medicine

## 2022-07-28 DIAGNOSIS — K219 Gastro-esophageal reflux disease without esophagitis: Secondary | ICD-10-CM

## 2022-09-25 ENCOUNTER — Other Ambulatory Visit: Payer: Self-pay | Admitting: Family Medicine

## 2022-09-25 DIAGNOSIS — G2581 Restless legs syndrome: Secondary | ICD-10-CM

## 2022-10-12 ENCOUNTER — Encounter: Payer: Self-pay | Admitting: Family Medicine

## 2022-10-12 ENCOUNTER — Ambulatory Visit (INDEPENDENT_AMBULATORY_CARE_PROVIDER_SITE_OTHER): Payer: No Typology Code available for payment source | Admitting: Family Medicine

## 2022-10-12 VITALS — BP 130/84 | HR 93 | Temp 97.1°F | Ht 70.0 in | Wt 192.0 lb

## 2022-10-12 DIAGNOSIS — F320 Major depressive disorder, single episode, mild: Secondary | ICD-10-CM | POA: Diagnosis not present

## 2022-10-12 DIAGNOSIS — G2581 Restless legs syndrome: Secondary | ICD-10-CM

## 2022-10-12 DIAGNOSIS — I152 Hypertension secondary to endocrine disorders: Secondary | ICD-10-CM

## 2022-10-12 DIAGNOSIS — E785 Hyperlipidemia, unspecified: Secondary | ICD-10-CM

## 2022-10-12 DIAGNOSIS — E119 Type 2 diabetes mellitus without complications: Secondary | ICD-10-CM

## 2022-10-12 DIAGNOSIS — E1169 Type 2 diabetes mellitus with other specified complication: Secondary | ICD-10-CM

## 2022-10-12 DIAGNOSIS — Z77011 Contact with and (suspected) exposure to lead: Secondary | ICD-10-CM | POA: Diagnosis not present

## 2022-10-12 DIAGNOSIS — E1159 Type 2 diabetes mellitus with other circulatory complications: Secondary | ICD-10-CM

## 2022-10-12 LAB — BAYER DCA HB A1C WAIVED: HB A1C (BAYER DCA - WAIVED): 7.4 % — ABNORMAL HIGH (ref 4.8–5.6)

## 2022-10-12 MED ORDER — PRAMIPEXOLE DIHYDROCHLORIDE 0.25 MG PO TABS: 0.2500 mg | ORAL_TABLET | Freq: Three times a day (TID) | ORAL | 3 refills | Status: DC

## 2022-10-12 NOTE — Progress Notes (Signed)
BP 130/84   Pulse 93   Temp (!) 97.1 F (36.2 C)   Ht _0  (1.778 m)   Wt 192 lb (87.1 kg)   SpO2 97%   BMI 27.55 kg/m    Subjective:   Patient ID: Sean Price, male    DOB: 05-11-1956, 66 y.o.   MRN: [id number]  HPI: Sean Price is a 66 y.o. male presenting on 10/12/2022 for Medical Management of Chronic Issues and Diabetes   HPI Type 2 diabetes mellitus Patient comes in today for recheck of his diabetes. Patient has been currently taking glipizide and metformin. Patient is currently on an ACE inhibitor/ARB. Patient has seen an ophthalmologist this year. Patient denies any issues with their feet. The symptom started onset as an adult hypertension and hyperlipidemia and history of A-fib ARE RELATED TO DM   Hypertension Patient is currently on lisinopril hydrochlorothiazide, and their blood pressure today is 130/84. Patient denies any lightheadedness or dizziness. Patient denies headaches, blurred vision, chest pains, shortness of breath, or weakness. Denies any side effects from medication and is content with current medication.   Hyperlipidemia Patient is coming in for recheck of his hyperlipidemia. The patient is currently taking Crestor. They deny any issues with myalgias or history of liver damage from it. They deny any focal numbness or weakness or chest pain.   Depression recheck Patient is coming in for depression recheck.  He currently takes Wellbutrin and feels like it does okay for him.  He is struggling a little bit with everything right now because they are dealing with his mother's declining  Relevant past medical, surgical, family and social history reviewed and updated as indicated. Interim medical history since our last visit reviewed. Allergies and medications reviewed and updated.  Review of Systems  Constitutional:  Negative for chills and fever.  Eyes:  Negative for visual disturbance.  Respiratory:  Negative for shortness of breath and  wheezing.   Cardiovascular:  Negative for chest pain and leg swelling.  Musculoskeletal:  Negative for back pain and gait problem.  Skin:  Negative for rash.  Neurological:  Negative for dizziness, weakness and light-headedness.  All other systems reviewed and are negative.   Per HPI unless specifically indicated above   Allergies as of 10/12/2022   No Known Allergies      Medication List        Accurate as of October 12, 2022  8:35 AM. If you have any questions, ask your nurse or doctor.          BLOOD GLUCOSE TEST STRIPS Strp 1 strip by In Vitro route 2 (two) times daily.   buPROPion 300 MG 24 hr tablet Commonly known as: WELLBUTRIN XL Take 1 tablet (300 mg total) by mouth daily. TAKE 1 TABLET BY MOUTH ONCE DAILY   cyanocobalamin 500 MCG tablet Commonly known as: VITAMIN B12 Take 500 mcg by mouth daily.   D3-1000 PO Take by mouth daily.   dimenhyDRINATE 50 MG tablet Commonly known as: DRAMAMINE Take 50 mg by mouth every 8 (eight) hours as needed.   glipiZIDE 5 MG tablet Commonly known as: GLUCOTROL Take 1 tablet (5 mg total) by mouth 2 (two) times daily before a meal.   glucose 5 g chewable tablet Chew 15 g by mouth as needed for low blood sugar.   lisinopril-hydrochlorothiazide 20-25 MG tablet Commonly known as: ZESTORETIC Take 0.5 tablets by mouth daily.   Melatonin 5 MG Caps Take by mouth at bedtime.  metFORMIN 500 MG tablet Commonly known as: GLUCOPHAGE Take 1 tablet (500 mg total) by mouth 2 (two) times daily with a meal.   onetouch ultrasoft lancets Use as instructed   pantoprazole 40 MG tablet Commonly known as: PROTONIX Take 1 tablet by mouth once daily   PARoxetine 20 MG tablet Commonly known as: PAXIL Take 1 tablet (20 mg total) by mouth daily.   pramipexole 0.25 MG tablet Commonly known as: MIRAPEX Take 1 tablet (0.25 mg total) by mouth 3 (three) times daily.   rosuvastatin 10 MG tablet Commonly known as: Crestor Take 1  tablet (10 mg total) by mouth daily.   sucralfate 1 g tablet Commonly known as: CARAFATE TAKE 1 TABLET BY MOUTH 4 TIMES DAILY WITH MEALS AND AT BEDTIME   vitamin C 100 MG tablet Take 100 mg by mouth daily.         Objective:   BP 130/84   Pulse 93   Temp (!) 97.1 F (36.2 C)   Ht _0  (1.778 m)   Wt 192 lb (87.1 kg)   SpO2 97%   BMI 27.55 kg/m   Wt Readings from Last 3 Encounters:  10/12/22 192 lb (87.1 kg)  07/11/22 178 lb (80.7 kg)  04/13/22 178 lb (80.7 kg)    Physical Exam Vitals and nursing note reviewed.  Constitutional:      General: He is not in acute distress.    Appearance: He is well-developed. He is not diaphoretic.  Eyes:     General: No scleral icterus.    Conjunctiva/sclera: Conjunctivae normal.  Neck:     Thyroid: No thyromegaly.  Cardiovascular:     Rate and Rhythm: Normal rate and regular rhythm.     Heart sounds: Normal heart sounds. No murmur heard. Pulmonary:     Effort: Pulmonary effort is normal. No respiratory distress.     Breath sounds: Normal breath sounds. No wheezing.  Musculoskeletal:        General: No swelling. Normal range of motion.     Cervical back: Neck supple.  Lymphadenopathy:     Cervical: No cervical adenopathy.  Skin:    General: Skin is warm and dry.     Findings: No rash.  Neurological:     Mental Status: He is alert and oriented to person, place, and time.     Coordination: Coordination normal.  Psychiatric:        Behavior: Behavior normal.     7.4 A1c  Assessment & Plan:   Problem List Items Addressed This Visit       Cardiovascular and Mediastinum   Hypertension associated with diabetes ([place])   Relevant Orders   CBC with Differential/Platelet   CMP14+EGFR   Lipid panel   Bayer DCA Hb A1c Waived     Endocrine   Type 2 diabetes mellitus without complications ([place]) - Primary   Relevant Orders   CBC with Differential/Platelet   CMP14+EGFR   Lipid panel   Bayer DCA Hb A1c Waived    Microalbumin / creatinine urine ratio   Hyperlipidemia associated with type 2 diabetes mellitus ([place])   Relevant Orders   CBC with Differential/Platelet   CMP14+EGFR   Lipid panel   Bayer DCA Hb A1c Waived     Other   Depression, major, single episode, mild (HCC)   Other Visit Diagnoses     Lead exposure       Relevant Orders   Lead, blood   RLS (restless legs syndrome)  Relevant Medications   pramipexole (MIRAPEX) 0.25 MG tablet       A1c slightly up but is dealing with a lot in his family and he says that he is eating more comfort food and knows that.  He is going to refill his change medicine. Follow up plan: Return in about 3 months (around 01/11/2023), or if symptoms worsen or fail to improve, for Diabetes recheck.  Counseling provided for all of the vaccine components Orders Placed This Encounter  Procedures   CBC with Differential/Platelet   CMP14+EGFR   Lipid panel   Bayer DCA Hb A1c Waived   Lead, blood   Microalbumin / creatinine urine ratio    Caryl Pina, MD [facility] 10/12/2022, 8:35 AM

## 2022-10-14 LAB — CBC WITH DIFFERENTIAL/PLATELET
Basophils Absolute: 0 10*3/uL (ref 0.0–0.2)
Basos: 1 %
EOS (ABSOLUTE): 0.3 10*3/uL (ref 0.0–0.4)
Eos: 4 %
Hematocrit: 42.5 % (ref 37.5–51.0)
Hemoglobin: 14.1 g/dL (ref 13.0–17.7)
Immature Grans (Abs): 0 10*3/uL (ref 0.0–0.1)
Immature Granulocytes: 0 %
Lymphocytes Absolute: 1.9 10*3/uL (ref 0.7–3.1)
Lymphs: 26 %
MCH: 29.9 pg (ref 26.6–33.0)
MCHC: 33.2 g/dL (ref 31.5–35.7)
MCV: 90 fL (ref 79–97)
Monocytes Absolute: 0.7 10*3/uL (ref 0.1–0.9)
Monocytes: 10 %
Neutrophils Absolute: 4.1 10*3/uL (ref 1.4–7.0)
Neutrophils: 59 %
Platelets: 237 10*3/uL (ref 150–450)
RBC: 4.71 x10E6/uL (ref 4.14–5.80)
RDW: 12.7 % (ref 11.6–15.4)
WBC: 7 10*3/uL (ref 3.4–10.8)

## 2022-10-14 LAB — CMP14+EGFR
ALT: 12 IU/L (ref 0–44)
AST: 8 IU/L (ref 0–40)
Albumin/Globulin Ratio: 2.3 — ABNORMAL HIGH (ref 1.2–2.2)
Albumin: 4.3 g/dL (ref 3.9–4.9)
Alkaline Phosphatase: 92 IU/L (ref 44–121)
BUN/Creatinine Ratio: 15 (ref 10–24)
BUN: 17 mg/dL (ref 8–27)
Bilirubin Total: 0.2 mg/dL (ref 0.0–1.2)
CO2: 25 mmol/L (ref 20–29)
Calcium: 9.5 mg/dL (ref 8.6–10.2)
Chloride: 101 mmol/L (ref 96–106)
Creatinine, Ser: 1.12 mg/dL (ref 0.76–1.27)
Globulin, Total: 1.9 g/dL (ref 1.5–4.5)
Glucose: 158 mg/dL — ABNORMAL HIGH (ref 70–99)
Potassium: 4.3 mmol/L (ref 3.5–5.2)
Sodium: 139 mmol/L (ref 134–144)
Total Protein: 6.2 g/dL (ref 6.0–8.5)
eGFR: 73 mL/min/{1.73_m2} (ref >59)

## 2022-10-14 LAB — LIPID PANEL
Chol/HDL Ratio: 2.4 ratio (ref 0.0–5.0)
Cholesterol, Total: 136 mg/dL (ref 100–199)
HDL: 57 mg/dL (ref >39)
LDL Chol Calc (NIH): 64 mg/dL (ref 0–99)
Triglycerides: 74 mg/dL (ref 0–149)
VLDL Cholesterol Cal: 15 mg/dL (ref 5–40)

## 2022-10-14 LAB — LEAD, BLOOD (ADULT >= 16 YRS): Lead-Whole Blood: 6.7 ug/dL — ABNORMAL HIGH (ref 0.0–3.4)

## 2022-10-25 ENCOUNTER — Other Ambulatory Visit: Payer: Self-pay | Admitting: Family Medicine

## 2022-10-25 DIAGNOSIS — E1169 Type 2 diabetes mellitus with other specified complication: Secondary | ICD-10-CM

## 2022-10-25 DIAGNOSIS — Z961 Presence of intraocular lens: Secondary | ICD-10-CM | POA: Diagnosis not present

## 2022-10-25 DIAGNOSIS — E119 Type 2 diabetes mellitus without complications: Secondary | ICD-10-CM

## 2022-11-29 DIAGNOSIS — M545 Low back pain, unspecified: Secondary | ICD-10-CM | POA: Diagnosis not present

## 2022-12-01 DIAGNOSIS — M542 Cervicalgia: Secondary | ICD-10-CM | POA: Diagnosis not present

## 2022-12-02 DIAGNOSIS — S161XXA Strain of muscle, fascia and tendon at neck level, initial encounter: Secondary | ICD-10-CM | POA: Diagnosis not present

## 2022-12-02 DIAGNOSIS — M5442 Lumbago with sciatica, left side: Secondary | ICD-10-CM | POA: Diagnosis not present

## 2022-12-06 DIAGNOSIS — M25511 Pain in right shoulder: Secondary | ICD-10-CM | POA: Diagnosis not present

## 2022-12-06 DIAGNOSIS — Z6827 Body mass index (BMI) 27.0-27.9, adult: Secondary | ICD-10-CM | POA: Diagnosis not present

## 2022-12-06 DIAGNOSIS — M4722 Other spondylosis with radiculopathy, cervical region: Secondary | ICD-10-CM | POA: Diagnosis not present

## 2022-12-12 ENCOUNTER — Ambulatory Visit (INDEPENDENT_AMBULATORY_CARE_PROVIDER_SITE_OTHER): Payer: Medicare HMO | Admitting: Family Medicine

## 2022-12-12 ENCOUNTER — Encounter: Payer: Self-pay | Admitting: Family Medicine

## 2022-12-12 VITALS — BP 134/85 | HR 63 | Ht 70.0 in | Wt 190.0 lb

## 2022-12-12 DIAGNOSIS — Z23 Encounter for immunization: Secondary | ICD-10-CM

## 2022-12-12 DIAGNOSIS — Z1211 Encounter for screening for malignant neoplasm of colon: Secondary | ICD-10-CM

## 2022-12-12 DIAGNOSIS — Z0000 Encounter for general adult medical examination without abnormal findings: Secondary | ICD-10-CM

## 2022-12-12 NOTE — Progress Notes (Signed)
Subjective:   Sean Price is a 67 y.o. male who presents for a Welcome to Medicare exam.   Review of Systems: Review of Systems  Constitutional:  Negative for chills and fever.  HENT:  Negative for ear pain and tinnitus.   Eyes:  Negative for blurred vision and pain.  Respiratory:  Negative for cough, shortness of breath and wheezing.   Cardiovascular:  Negative for chest pain, palpitations and leg swelling.  Gastrointestinal:  Negative for abdominal pain, blood in stool, constipation, diarrhea and melena.  Genitourinary:  Negative for dysuria and hematuria.  Musculoskeletal:  Negative for back pain, joint pain and myalgias.  Skin:  Negative for rash.  Neurological:  Negative for dizziness, sensory change, focal weakness, weakness and headaches.  Psychiatric/Behavioral:  Negative for depression and suicidal ideas.     Cardiac Risk Factors include: advanced age (>3mn, >>4women);male gender The 10-year ASCVD risk score (Arnett DK, et al., 2019) is: 20.8%   Values used to calculate the score:     Age: [age]years     Sex: Male     Is Non-Hispanic African American: No     Diabetic: Yes     Tobacco smoker: No     Systolic Blood Pressure: 1Q000111QmmHg     Is BP treated: Yes     HDL Cholesterol: 57 mg/dL     Total Cholesterol: 136 mg/dL      Objective:    Today's Vitals   12/12/22 0930 12/12/22 0938  BP:  134/85  Pulse:  63  SpO2:  99%  Weight:  190 lb (86.2 kg)  Height: 5' 10"$  (1.778 m) 5' 10"$  (1.778 m)  PainSc:  6    Body mass index is 27.26 kg/m.  Medications Outpatient Encounter Medications as of 12/12/2022  Medication Sig   Ascorbic Acid (VITAMIN C) 100 MG tablet Take 100 mg by mouth daily.   buPROPion (WELLBUTRIN XL) 300 MG 24 hr tablet Take 1 tablet (300 mg total) by mouth daily. TAKE 1 TABLET BY MOUTH ONCE DAILY   Cholecalciferol (D3-1000 PO) Take by mouth daily.   dimenhyDRINATE (DRAMAMINE) 50 MG tablet Take 50 mg by mouth every 8 (eight) hours as needed.    glipiZIDE (GLUCOTROL) 5 MG tablet Take 1 tablet (5 mg total) by mouth 2 (two) times daily before a meal.   glucose 5 g chewable tablet Chew 15 g by mouth as needed for low blood sugar.   Glucose Blood (BLOOD GLUCOSE TEST STRIPS) STRP 1 strip by In Vitro route 2 (two) times daily.   Lancets (ONETOUCH ULTRASOFT) lancets Use as instructed   lisinopril-hydrochlorothiazide (ZESTORETIC) 20-25 MG tablet Take 0.5 tablets by mouth daily.   Melatonin 5 MG CAPS Take by mouth at bedtime.   metFORMIN (GLUCOPHAGE) 500 MG tablet Take 1 tablet (500 mg total) by mouth 2 (two) times daily with a meal.   pantoprazole (PROTONIX) 40 MG tablet Take 1 tablet by mouth once daily   PARoxetine (PAXIL) 20 MG tablet Take 1 tablet (20 mg total) by mouth daily.   pramipexole (MIRAPEX) 0.25 MG tablet Take 1 tablet (0.25 mg total) by mouth 3 (three) times daily.   rosuvastatin (CRESTOR) 10 MG tablet Take 1 tablet by mouth once daily   sucralfate (CARAFATE) 1 g tablet TAKE 1 TABLET BY MOUTH 4 TIMES DAILY WITH MEALS AND AT BEDTIME   vitamin B-12 (CYANOCOBALAMIN) 500 MCG tablet Take 500 mcg by mouth daily.   No facility-administered encounter medications on file as  of 12/12/2022.     History: Past Medical History:  Diagnosis Date   ADD (attention deficit disorder)    Anxiety    Atrial fibrillation with RVR ([place])    new found on 12/18/10 visit   Bipolar disorder ([place]) 01/2014   BPH with elevated PSA    Depression    Diabetes mellitus without complication (HCC)    GERD (gastroesophageal reflux disease)    Hyperlipidemia    Hypertension    Myocardial infarction [facility])    Sinus arrhythmia    Past Surgical History:  Procedure Laterality Date   bilateral inguinal hernia     CARPAL TUNNEL RELEASE  1999   left hand   CERVICAL FUSION  2007   c5-c6   COLONOSCOPY     compound fracture  1999, 2001, 2001   right leg, insertion of hardware in 2001, removal of hardware 2001   FRACTURE SURGERY     LEG SURGERY Right     hardware removal   PROSTATE BIOPSY     TRUS/BX  BPH only   TONSILLECTOMY  1970    Family History  Problem Relation Age of Onset   Colon cancer Brother 83   Breast cancer Sister        mid 33's   Diabetes Mother    Heart attack Father 73       massive /died   Other Father        blood infection   Social History   Occupational History   Occupation: Risk analyst: [employer]  Tobacco Use   Smoking status: Former    Years: 4.00    Types: Cigarettes    Quit date: 05/29/1984    Years since quitting: 38.5   Smokeless tobacco: Never  Vaping Use   Vaping Use: Never used  Substance and Sexual Activity   Alcohol use: Yes    Alcohol/week: 1.0 standard drink of alcohol    Types: 1 Standard drinks or equivalent per week    Comment: social   Drug use: No   Sexual activity: Yes   Tobacco Counseling Counseling given: Not Answered   Immunizations and Health Maintenance  There is no immunization history on file for this patient. Health Maintenance Due  Topic Date Due   COVID-19 Vaccine (1) Never done   DTaP/Tdap/Td (1 - Tdap) Never done   Diabetic kidney evaluation - Urine ACR  04/12/2018    Activities of Daily Living    12/12/2022    9:41 AM  In your present state of health, do you have any difficulty performing the following activities:  Hearing? 0  Vision? 1  Comment Reading glasses  Difficulty concentrating or making decisions? 1  Walking or climbing stairs? 0  Dressing or bathing? 0  Doing errands, shopping? 0  Preparing Food and eating ? N  Using the Toilet? N  In the past six months, have you accidently leaked urine? N  Do you have problems with loss of bowel control? N  Managing your Medications? N  Managing your Finances? N  Housekeeping or managing your Housekeeping? N    Physical Exam  (optional), or other factors deemed appropriate based on the beneficiary's medical and social history and current clinical  standards.  Advanced Directives: Does Patient Have a Medical Advance Directive?: Yes Does patient want to make changes to medical advance directive?: Yes (Inpatient - patient defers changing a medical advance directive at this time - Information given) Would patient like information on creating  a medical advance directive?: No - Guardian declined    Assessment:    This is a routine wellness  examination for this patient .  Problem List Items Addressed This Visit   None Visit Diagnoses     Colon cancer screening    -  Primary   Relevant Orders   Ambulatory referral to Gastroenterology   Encounter for Medicare annual wellness exam            Vision/Hearing screen No results found.  Dietary issues and exercise activities discussed:  Current Exercise Habits: Home exercise routine, Type of exercise: walking, Time (Minutes): 60, Frequency (Times/Week): 7, Weekly Exercise (Minutes/Week): 420, Intensity: Mild, Exercise limited by: orthopedic condition(s)   Goals   None     Depression Screen    12/12/2022    9:30 AM 10/12/2022    7:58 AM 07/11/2022    9:30 AM 04/04/2022    8:34 AM  PHQ 2/9 Scores  PHQ - 2 Score 3 3 3 3  $ PHQ- 9 Score 9 9 8 14     $ Fall Risk    12/12/2022    9:30 AM  Fall Risk   Falls in the past year? 0    Cognitive Function        12/12/2022    9:44 AM  6CIT Screen  What Year? 0 points  What month? 0 points  What time? 0 points  Count back from 20 0 points  Months in reverse 0 points  Repeat phrase 2 points  Total Score 2 points      Patient Care Team: Haelee Bolen, Fransisca Kaufmann, MD as PCP - General (Family Medicine)     Plan:     I have personally reviewed and noted the following in the patient's chart:   Medical and social history Use of alcohol, tobacco or illicit drugs  Current medications and supplements Functional ability and status Nutritional status Physical activity Advanced directives List of other physicians Hospitalizations,  surgeries, and ER visits in previous 12 months Vitals Screenings to include cognitive, depression, and falls Referrals and appointments  In addition, I have reviewed and discussed with patient certain preventive protocols, quality metrics, and best practice recommendations. A written personalized care plan for preventive services as well as general preventive health recommendations were provided to patient.    Worthy Rancher, MD 12/12/2022

## 2022-12-12 NOTE — Addendum Note (Signed)
Addended by: Alphonzo Dublin on: 12/12/2022 10:52 AM   Modules accepted: Orders

## 2022-12-13 ENCOUNTER — Encounter: Payer: Self-pay | Admitting: Internal Medicine

## 2022-12-15 DIAGNOSIS — M25511 Pain in right shoulder: Secondary | ICD-10-CM | POA: Diagnosis not present

## 2022-12-19 DIAGNOSIS — M25511 Pain in right shoulder: Secondary | ICD-10-CM | POA: Diagnosis not present

## 2022-12-19 DIAGNOSIS — M75101 Unspecified rotator cuff tear or rupture of right shoulder, not specified as traumatic: Secondary | ICD-10-CM | POA: Diagnosis not present

## 2022-12-19 DIAGNOSIS — Z6827 Body mass index (BMI) 27.0-27.9, adult: Secondary | ICD-10-CM | POA: Diagnosis not present

## 2022-12-22 ENCOUNTER — Ambulatory Visit (AMBULATORY_SURGERY_CENTER): Payer: Medicare HMO

## 2022-12-22 VITALS — Ht 70.0 in | Wt 190.0 lb

## 2022-12-22 DIAGNOSIS — Z86010 Personal history of colonic polyps: Secondary | ICD-10-CM

## 2022-12-22 NOTE — Progress Notes (Signed)

## 2022-12-23 DIAGNOSIS — M75121 Complete rotator cuff tear or rupture of right shoulder, not specified as traumatic: Secondary | ICD-10-CM | POA: Diagnosis not present

## 2022-12-29 ENCOUNTER — Encounter: Payer: Self-pay | Admitting: Internal Medicine

## 2023-01-12 ENCOUNTER — Encounter: Payer: Self-pay | Admitting: Family Medicine

## 2023-01-12 ENCOUNTER — Ambulatory Visit (INDEPENDENT_AMBULATORY_CARE_PROVIDER_SITE_OTHER): Payer: Medicare HMO | Admitting: Family Medicine

## 2023-01-12 VITALS — BP 158/72 | HR 55 | Ht 70.0 in | Wt 195.0 lb

## 2023-01-12 DIAGNOSIS — E1159 Type 2 diabetes mellitus with other circulatory complications: Secondary | ICD-10-CM | POA: Diagnosis not present

## 2023-01-12 DIAGNOSIS — Z7984 Long term (current) use of oral hypoglycemic drugs: Secondary | ICD-10-CM

## 2023-01-12 DIAGNOSIS — E785 Hyperlipidemia, unspecified: Secondary | ICD-10-CM

## 2023-01-12 DIAGNOSIS — E119 Type 2 diabetes mellitus without complications: Secondary | ICD-10-CM

## 2023-01-12 DIAGNOSIS — E1169 Type 2 diabetes mellitus with other specified complication: Secondary | ICD-10-CM

## 2023-01-12 DIAGNOSIS — I152 Hypertension secondary to endocrine disorders: Secondary | ICD-10-CM

## 2023-01-12 LAB — BAYER DCA HB A1C WAIVED: HB A1C (BAYER DCA - WAIVED): 7.2 % — ABNORMAL HIGH (ref 4.8–5.6)

## 2023-01-12 NOTE — Progress Notes (Signed)
BP (!) 158/72   Pulse (!) 55   Ht '5\' 10"'$  (1.778 m)   Wt 195 lb (88.5 kg)   SpO2 99%   BMI 27.98 kg/m    Subjective:   Patient ID: Sean Price, male    DOB: 10-03-1956, 67 y.o.   MRN: [id number]  HPI: Sean Price is a 67 y.o. male presenting on 01/12/2023 for No chief complaint on file.   HPI Type 2 diabetes mellitus Patient comes in today for recheck of his diabetes. Patient has been currently taking metformin and glipizide. Patient is currently on an ACE inhibitor/ARB. Patient has not seen an ophthalmologist this year. Patient denies any issues with their feet. The symptom started onset as an adult hypertension and hyperlipidemia ARE RELATED TO DM   Hypertension Patient is currently on lisinopril hydrochlorothiazide, and their blood pressure today is 158/72 but says that it is running very well at home. Patient denies any lightheadedness or dizziness. Patient denies headaches, blurred vision, chest pains, shortness of breath, or weakness. Denies any side effects from medication and is content with current medication.   Hyperlipidemia Patient is coming in for recheck of his hyperlipidemia. The patient is currently taking Crestor. They deny any issues with myalgias or history of liver damage from it. They deny any focal numbness or weakness or chest pain.   Relevant past medical, surgical, family and social history reviewed and updated as indicated. Interim medical history since our last visit reviewed. Allergies and medications reviewed and updated.  Review of Systems  Constitutional:  Negative for chills and fever.  Eyes:  Negative for visual disturbance.  Respiratory:  Negative for shortness of breath and wheezing.   Cardiovascular:  Negative for chest pain and leg swelling.  Musculoskeletal:  Positive for arthralgias (Right shoulder, seeing neurology for it). Negative for back pain and gait problem.  Skin:  Negative for rash.  Neurological:  Negative for  dizziness, weakness and numbness.  All other systems reviewed and are negative.   Per HPI unless specifically indicated above   Allergies as of 01/12/2023   No Known Allergies      Medication List        Accurate as of January 12, 2023  8:20 AM. If you have any questions, ask your nurse or doctor.          STOP taking these medications    cyanocobalamin 500 MCG tablet Commonly known as: VITAMIN B12 Stopped by: Worthy Rancher, MD   D3-1000 PO Stopped by: Fransisca Kaufmann Evaluna Utke, MD   dimenhyDRINATE 50 MG tablet Commonly known as: DRAMAMINE Stopped by: Worthy Rancher, MD   Melatonin 5 MG Caps Stopped by: Fransisca Kaufmann Vernelle Wisner, MD   predniSONE 10 MG (21) Tbpk tablet Commonly known as: STERAPRED UNI-PAK 21 TAB Stopped by: Fransisca Kaufmann Garhett Bernhard, MD   sucralfate 1 g tablet Commonly known as: CARAFATE Stopped by: Worthy Rancher, MD   vitamin C 100 MG tablet Stopped by: Fransisca Kaufmann Cindra Austad, MD       TAKE these medications    BLOOD GLUCOSE TEST STRIPS Strp 1 strip by In Vitro route 2 (two) times daily.   buPROPion 300 MG 24 hr tablet Commonly known as: WELLBUTRIN XL Take 1 tablet (300 mg total) by mouth daily. TAKE 1 TABLET BY MOUTH ONCE DAILY   glipiZIDE 5 MG tablet Commonly known as: GLUCOTROL Take 1 tablet (5 mg total) by mouth 2 (two) times daily before a meal.   glucose 5 g  chewable tablet Chew 15 g by mouth as needed for low blood sugar.   HYDROcodone-acetaminophen 5-325 MG tablet Commonly known as: NORCO/VICODIN Take 1 tablet by mouth every 6 (six) hours as needed.   lisinopril-hydrochlorothiazide 20-25 MG tablet Commonly known as: ZESTORETIC Take 0.5 tablets by mouth daily.   metFORMIN 500 MG tablet Commonly known as: GLUCOPHAGE Take 1 tablet (500 mg total) by mouth 2 (two) times daily with a meal.   onetouch ultrasoft lancets Use as instructed   pantoprazole 40 MG tablet Commonly known as: PROTONIX Take 1 tablet by mouth once daily    PARoxetine 20 MG tablet Commonly known as: PAXIL Take 1 tablet (20 mg total) by mouth daily.   pramipexole 0.25 MG tablet Commonly known as: MIRAPEX Take 1 tablet (0.25 mg total) by mouth 3 (three) times daily.   rosuvastatin 10 MG tablet Commonly known as: CRESTOR Take 1 tablet by mouth once daily   tiZANidine 4 MG tablet Commonly known as: ZANAFLEX         Objective:   There were no vitals taken for this visit.  Wt Readings from Last 3 Encounters:  12/22/22 190 lb (86.2 kg)  12/12/22 190 lb (86.2 kg)  10/12/22 192 lb (87.1 kg)    Physical Exam Vitals and nursing note reviewed.  Constitutional:      General: He is not in acute distress.    Appearance: He is well-developed. He is not diaphoretic.  Eyes:     General: No scleral icterus.    Conjunctiva/sclera: Conjunctivae normal.  Neck:     Thyroid: No thyromegaly.  Cardiovascular:     Rate and Rhythm: Normal rate and regular rhythm.     Heart sounds: Normal heart sounds. No murmur heard. Pulmonary:     Effort: Pulmonary effort is normal. No respiratory distress.     Breath sounds: Normal breath sounds. No wheezing.  Musculoskeletal:        General: No swelling. Normal range of motion.     Cervical back: Neck supple.  Lymphadenopathy:     Cervical: No cervical adenopathy.  Skin:    General: Skin is warm and dry.     Findings: No rash.  Neurological:     Mental Status: He is alert and oriented to person, place, and time.     Coordination: Coordination normal.  Psychiatric:        Behavior: Behavior normal.       Assessment & Plan:   Problem List Items Addressed This Visit       Cardiovascular and Mediastinum   Hypertension associated with diabetes ([place])     Endocrine   Type 2 diabetes mellitus without complications ([place]) - Primary   Relevant Orders   Bayer DCA Hb A1c Waived   Hyperlipidemia associated with type 2 diabetes mellitus (HCC)    A1c is 7.2 which is improved from last time,  continue current medicine and focus on diet. Follow up plan: Return in about 3 months (around 04/14/2023), or if symptoms worsen or fail to improve, for Diabetes and hypertension and cholesterol.  Counseling provided for all of the vaccine components Orders Placed This Encounter  Procedures   Bayer [place] Hb A1c [place] Yerick Eggebrecht, MD [facility] 01/12/2023, 8:20 AM

## 2023-01-14 LAB — MICROALBUMIN / CREATININE URINE RATIO
Creatinine, Urine: 67.1 mg/dL
Microalb/Creat Ratio: 5 mg/g creat (ref 0–29)
Microalbumin, Urine: 3.1 ug/mL

## 2023-01-19 ENCOUNTER — Encounter: Payer: Self-pay | Admitting: Internal Medicine

## 2023-01-24 DIAGNOSIS — M67813 Other specified disorders of tendon, right shoulder: Secondary | ICD-10-CM | POA: Diagnosis not present

## 2023-01-24 DIAGNOSIS — M7541 Impingement syndrome of right shoulder: Secondary | ICD-10-CM | POA: Diagnosis not present

## 2023-01-24 DIAGNOSIS — X58XXXA Exposure to other specified factors, initial encounter: Secondary | ICD-10-CM | POA: Diagnosis not present

## 2023-01-24 DIAGNOSIS — Y999 Unspecified external cause status: Secondary | ICD-10-CM | POA: Diagnosis not present

## 2023-01-24 DIAGNOSIS — M24111 Other articular cartilage disorders, right shoulder: Secondary | ICD-10-CM | POA: Diagnosis not present

## 2023-01-24 DIAGNOSIS — S46011A Strain of muscle(s) and tendon(s) of the rotator cuff of right shoulder, initial encounter: Secondary | ICD-10-CM | POA: Diagnosis not present

## 2023-01-24 DIAGNOSIS — G8918 Other acute postprocedural pain: Secondary | ICD-10-CM | POA: Diagnosis not present

## 2023-01-28 ENCOUNTER — Other Ambulatory Visit: Payer: Self-pay | Admitting: Family Medicine

## 2023-01-28 DIAGNOSIS — E1159 Type 2 diabetes mellitus with other circulatory complications: Secondary | ICD-10-CM

## 2023-01-28 DIAGNOSIS — K219 Gastro-esophageal reflux disease without esophagitis: Secondary | ICD-10-CM

## 2023-02-06 ENCOUNTER — Other Ambulatory Visit: Payer: Self-pay | Admitting: Family Medicine

## 2023-02-06 DIAGNOSIS — F320 Major depressive disorder, single episode, mild: Secondary | ICD-10-CM

## 2023-02-12 ENCOUNTER — Telehealth: Payer: Self-pay | Admitting: Gastroenterology

## 2023-02-12 NOTE — Telephone Encounter (Signed)
Patient called with concerns about proceeding with colonoscopy tomorrow as scheduled because he had shoulder surgery 3 weeks ago. Right arm is requires a sling. Having doubts after doing an Therapist, art. Wishes to reschedule in 2 months.

## 2023-02-13 ENCOUNTER — Encounter: Payer: Self-pay | Admitting: Internal Medicine

## 2023-02-13 NOTE — Telephone Encounter (Signed)
Please reschedule

## 2023-02-13 NOTE — Telephone Encounter (Signed)
Phoned pt to reschedule procedure.  He would like to talk with the orthopedic Dr that did his shoulder surgery before rescheduling.  He will call back to reschedule once he has done this.

## 2023-02-14 DIAGNOSIS — M25511 Pain in right shoulder: Secondary | ICD-10-CM | POA: Diagnosis not present

## 2023-02-20 DIAGNOSIS — M25511 Pain in right shoulder: Secondary | ICD-10-CM | POA: Diagnosis not present

## 2023-02-23 DIAGNOSIS — M25511 Pain in right shoulder: Secondary | ICD-10-CM | POA: Diagnosis not present

## 2023-03-01 DIAGNOSIS — M25511 Pain in right shoulder: Secondary | ICD-10-CM | POA: Diagnosis not present

## 2023-03-02 DIAGNOSIS — M25511 Pain in right shoulder: Secondary | ICD-10-CM | POA: Diagnosis not present

## 2023-03-06 DIAGNOSIS — G5601 Carpal tunnel syndrome, right upper limb: Secondary | ICD-10-CM | POA: Diagnosis not present

## 2023-03-06 DIAGNOSIS — M25511 Pain in right shoulder: Secondary | ICD-10-CM | POA: Diagnosis not present

## 2023-03-09 DIAGNOSIS — M25511 Pain in right shoulder: Secondary | ICD-10-CM | POA: Diagnosis not present

## 2023-03-13 DIAGNOSIS — M25511 Pain in right shoulder: Secondary | ICD-10-CM | POA: Diagnosis not present

## 2023-03-16 DIAGNOSIS — M25511 Pain in right shoulder: Secondary | ICD-10-CM | POA: Diagnosis not present

## 2023-03-20 DIAGNOSIS — M25511 Pain in right shoulder: Secondary | ICD-10-CM | POA: Diagnosis not present

## 2023-03-28 DIAGNOSIS — M25511 Pain in right shoulder: Secondary | ICD-10-CM | POA: Diagnosis not present

## 2023-03-29 DIAGNOSIS — M25511 Pain in right shoulder: Secondary | ICD-10-CM | POA: Diagnosis not present

## 2023-03-30 DIAGNOSIS — M25511 Pain in right shoulder: Secondary | ICD-10-CM | POA: Diagnosis not present

## 2023-04-11 DIAGNOSIS — M25511 Pain in right shoulder: Secondary | ICD-10-CM | POA: Diagnosis not present

## 2023-04-14 ENCOUNTER — Ambulatory Visit (INDEPENDENT_AMBULATORY_CARE_PROVIDER_SITE_OTHER): Payer: Medicare HMO | Admitting: Family Medicine

## 2023-04-14 VITALS — BP 124/76 | HR 79 | Ht 70.0 in | Wt 185.0 lb

## 2023-04-14 DIAGNOSIS — I152 Hypertension secondary to endocrine disorders: Secondary | ICD-10-CM

## 2023-04-14 DIAGNOSIS — Z7984 Long term (current) use of oral hypoglycemic drugs: Secondary | ICD-10-CM

## 2023-04-14 DIAGNOSIS — E1169 Type 2 diabetes mellitus with other specified complication: Secondary | ICD-10-CM | POA: Diagnosis not present

## 2023-04-14 DIAGNOSIS — G2581 Restless legs syndrome: Secondary | ICD-10-CM | POA: Diagnosis not present

## 2023-04-14 DIAGNOSIS — E785 Hyperlipidemia, unspecified: Secondary | ICD-10-CM | POA: Diagnosis not present

## 2023-04-14 DIAGNOSIS — F310 Bipolar disorder, current episode hypomanic: Secondary | ICD-10-CM

## 2023-04-14 DIAGNOSIS — Z77011 Contact with and (suspected) exposure to lead: Secondary | ICD-10-CM

## 2023-04-14 DIAGNOSIS — E1159 Type 2 diabetes mellitus with other circulatory complications: Secondary | ICD-10-CM | POA: Diagnosis not present

## 2023-04-14 DIAGNOSIS — E119 Type 2 diabetes mellitus without complications: Secondary | ICD-10-CM | POA: Diagnosis not present

## 2023-04-14 DIAGNOSIS — F320 Major depressive disorder, single episode, mild: Secondary | ICD-10-CM

## 2023-04-14 LAB — BAYER DCA HB A1C WAIVED: HB A1C (BAYER DCA - WAIVED): 6.9 % — ABNORMAL HIGH (ref 4.8–5.6)

## 2023-04-14 MED ORDER — PRAMIPEXOLE DIHYDROCHLORIDE 0.5 MG PO TABS: 0.5000 mg | ORAL_TABLET | Freq: Three times a day (TID) | ORAL | 3 refills | Status: DC

## 2023-04-14 MED ORDER — METFORMIN HCL 500 MG PO TABS: 500.0000 mg | ORAL_TABLET | Freq: Two times a day (BID) | ORAL | 3 refills | Status: DC

## 2023-04-14 MED ORDER — ARIPIPRAZOLE 5 MG PO TABS
5.0000 mg | ORAL_TABLET | Freq: Every day | ORAL | 2 refills | Status: DC
Start: 2023-04-14 — End: 2023-07-04

## 2023-04-14 MED ORDER — BLOOD GLUCOSE TEST VI STRP: 1.0000 | ORAL_STRIP | Freq: Two times a day (BID) | 3 refills | Status: DC

## 2023-04-14 MED ORDER — GLIPIZIDE 5 MG PO TABS: 5.0000 mg | ORAL_TABLET | Freq: Two times a day (BID) | ORAL | 3 refills | Status: DC

## 2023-04-14 MED ORDER — ONETOUCH ULTRASOFT LANCETS MISC
12 refills | Status: DC
Start: 2023-04-14 — End: 2024-03-18

## 2023-04-14 MED ORDER — PAROXETINE HCL 20 MG PO TABS: 20.0000 mg | ORAL_TABLET | Freq: Every day | ORAL | 3 refills | Status: DC

## 2023-04-14 NOTE — Progress Notes (Signed)
BP 124/76   Pulse 79   Ht 5\' 10"  (1.778 m)   Wt 185 lb (83.9 kg)   SpO2 96%   BMI 26.54 kg/m    Subjective:   Patient ID: Sean Price, male    DOB: 1956-09-28, 67 y.o.   MRN: [id number]  HPI: Sean Price is a 67 y.o. male presenting on 04/14/2023 for Medical Management of Chronic Issues, Diabetes, and Gastroesophageal Reflux   HPI Type 2 diabetes mellitus Patient comes in today for recheck of his diabetes. Patient has been currently taking glipizide and metformin. Patient is currently on an ACE inhibitor/ARB. Patient has not seen an ophthalmologist this year. Patient denies any new issues with their feet. The symptom started onset as an adult hypertension and hyperlipidemia ARE RELATED TO DM   Hypertension Patient is currently on lisinopril hydrochlorothiazide, and their blood pressure today is 124/76. Patient denies any lightheadedness or dizziness. Patient denies headaches, blurred vision, chest pains, shortness of breath, or weakness. Denies any side effects from medication and is content with current medication.   Hyperlipidemia Patient is coming in for recheck of his hyperlipidemia. The patient is currently taking Crestor. They deny any issues with myalgias or history of liver damage from it. They deny any focal numbness or weakness or chest pain.   Depression recheck Patient is coming in today for depression recheck.  Currently takes Wellbutrin and paroxetine.  He says has been having a lot of decreased motivation and increased anxiety he still having his bipolar manic episodes at times per his wife.  He has also been having increased restless leg syndrome and not sleeping as well at night because his legs are constantly moving and also keeping her up.  Patient does have occasional passive suicidal ideations.  He denies any active plan.    04/14/2023   11:12 AM 01/12/2023    8:24 AM 12/12/2022    9:30 AM 10/12/2022    7:58 AM 07/11/2022    9:30 AM  Depression  screen PHQ 2/9  Decreased Interest 0 1 2 2 1   Down, Depressed, Hopeless 2 2 1 1 2   PHQ - 2 Score 2 3 3 3 3   Altered sleeping 0 3 3 3 2   Tired, decreased energy 0 1 1 1 1   Change in appetite 0 3 1 1 2   Feeling bad or failure about yourself  3 1 1 1  0  Trouble concentrating 3 1 0 0 0  Moving slowly or fidgety/restless 3 1 0 0 0  Suicidal thoughts 2 3 0 0 0  PHQ-9 Score 13 16 9 9 8   Difficult doing work/chores Somewhat difficult Somewhat difficult Not difficult at all Not difficult at all Not difficult at all     Relevant past medical, surgical, family and social history reviewed and updated as indicated. Interim medical history since our last visit reviewed. Allergies and medications reviewed and updated.  Review of Systems  Constitutional:  Negative for chills and fever.  Eyes:  Negative for visual disturbance.  Respiratory:  Negative for shortness of breath and wheezing.   Cardiovascular:  Negative for chest pain and leg swelling.  Musculoskeletal:  Negative for back pain and gait problem.  Skin:  Negative for rash.  Neurological:  Negative for dizziness.  Psychiatric/Behavioral:  Positive for decreased concentration and dysphoric mood. Negative for self-injury. The patient is nervous/anxious.   All other systems reviewed and are negative.   Per HPI unless specifically indicated above   Allergies as of  04/14/2023   No Known Allergies      Medication List        Accurate as of April 14, 2023 11:43 AM. If you have any questions, ask your nurse or doctor.          ARIPiprazole 5 MG tablet Commonly known as: Abilify Take 1 tablet (5 mg total) by mouth daily. Started by: Elige Radon Nicklaus Alviar, MD   BLOOD GLUCOSE TEST STRIPS Strp 1 each by In Vitro route 2 (two) times daily.   buPROPion 300 MG 24 hr tablet Commonly known as: WELLBUTRIN XL Take 1 tablet (300 mg total) by mouth daily. TAKE 1 TABLET BY MOUTH ONCE DAILY   glipiZIDE 5 MG tablet Commonly known as:  GLUCOTROL Take 1 tablet (5 mg total) by mouth 2 (two) times daily before a meal.   glucose 5 g chewable tablet Chew 15 g by mouth as needed for low blood sugar.   HYDROcodone-acetaminophen 5-325 MG tablet Commonly known as: NORCO/VICODIN Take 1 tablet by mouth every 6 (six) hours as needed.   lisinopril-hydrochlorothiazide 20-25 MG tablet Commonly known as: ZESTORETIC Take 1/2 (one-half) tablet by mouth once daily   metFORMIN 500 MG tablet Commonly known as: GLUCOPHAGE Take 1 tablet (500 mg total) by mouth 2 (two) times daily with a meal.   onetouch ultrasoft lancets Use as instructed   pantoprazole 40 MG tablet Commonly known as: PROTONIX Take 1 tablet by mouth once daily   PARoxetine 20 MG tablet Commonly known as: PAXIL Take 1 tablet (20 mg total) by mouth daily.   pramipexole 0.5 MG tablet Commonly known as: MIRAPEX Take 1 tablet (0.5 mg total) by mouth 3 (three) times daily. What changed:  medication strength how much to take Changed by: Elige Radon Allard Lightsey, MD   rosuvastatin 10 MG tablet Commonly known as: CRESTOR Take 1 tablet by mouth once daily   tiZANidine 4 MG tablet Commonly known as: ZANAFLEX         Objective:   BP 124/76   Pulse 79   Ht 5\' 10"  (1.778 m)   Wt 185 lb (83.9 kg)   SpO2 96%   BMI 26.54 kg/m   Wt Readings from Last 3 Encounters:  04/14/23 185 lb (83.9 kg)  01/12/23 195 lb (88.5 kg)  12/22/22 190 lb (86.2 kg)    Physical Exam Vitals and nursing note reviewed.  Constitutional:      General: He is not in acute distress.    Appearance: He is well-developed. He is not diaphoretic.  Eyes:     General: No scleral icterus.    Conjunctiva/sclera: Conjunctivae normal.  Neck:     Thyroid: No thyromegaly.  Cardiovascular:     Rate and Rhythm: Normal rate and regular rhythm.     Heart sounds: Normal heart sounds. No murmur heard. Pulmonary:     Effort: Pulmonary effort is normal. No respiratory distress.     Breath sounds:  Normal breath sounds. No wheezing.  Musculoskeletal:        General: No swelling. Normal range of motion.     Cervical back: Neck supple.  Lymphadenopathy:     Cervical: No cervical adenopathy.  Skin:    General: Skin is warm and dry.     Findings: No rash.  Neurological:     Mental Status: He is alert and oriented to person, place, and time.     Coordination: Coordination normal.  Psychiatric:        Behavior: Behavior normal.  Assessment & Plan:   Problem List Items Addressed This Visit       Cardiovascular and Mediastinum   Hypertension associated with diabetes (HCC)   Relevant Medications   glipiZIDE (GLUCOTROL) 5 MG tablet   metFORMIN (GLUCOPHAGE) 500 MG tablet   Other Relevant Orders   CBC with Differential/Platelet   CMP14+EGFR   Lipid panel   Bayer DCA Hb A1c Waived (Completed)     Endocrine   Type 2 diabetes mellitus without complications (HCC) - Primary   Relevant Medications   glipiZIDE (GLUCOTROL) 5 MG tablet   Glucose Blood (BLOOD GLUCOSE TEST STRIPS) STRP   Lancets (ONETOUCH ULTRASOFT) lancets   metFORMIN (GLUCOPHAGE) 500 MG tablet   Other Relevant Orders   CBC with Differential/Platelet   CMP14+EGFR   Lipid panel   Bayer DCA Hb A1c Waived (Completed)   Hyperlipidemia associated with type 2 diabetes mellitus (HCC)   Relevant Medications   glipiZIDE (GLUCOTROL) 5 MG tablet   metFORMIN (GLUCOPHAGE) 500 MG tablet   Other Relevant Orders   CBC with Differential/Platelet   CMP14+EGFR   Lipid panel   Bayer DCA Hb A1c Waived (Completed)     Other   Depression, major, single episode, mild (HCC)   Relevant Medications   PARoxetine (PAXIL) 20 MG tablet   ARIPiprazole (ABILIFY) 5 MG tablet   Other Visit Diagnoses     Lead exposure       Relevant Orders   Lead, blood   Lead, blood   RLS (restless legs syndrome)       Relevant Medications   pramipexole (MIRAPEX) 0.5 MG tablet   Bipolar affective disorder, current episode hypomanic (HCC)        Relevant Medications   ARIPiprazole (ABILIFY) 5 MG tablet       Add Abilify to his Wellbutrin and Paxil, in the future may taper off the Wellbutrin if he does well.  Increase pramipexole dose for restless legs. A1c was 6.9 which looks good.  Heart rate and everything else looks good.  No medication changes of the knees Follow up plan: Return in about 2 months (around 06/14/2023), or if symptoms worsen or fail to improve, for 3-month anxiety depression and bipolar recheck.  Counseling provided for all of the vaccine components Orders Placed This Encounter  Procedures   CBC with Differential/Platelet   CMP14+EGFR   Lipid panel   Bayer DCA Hb A1c Waived   Lead, blood   Lead, blood    Arville Care, MD [facility] 04/14/2023, 11:43 AM

## 2023-04-17 DIAGNOSIS — S46011D Strain of muscle(s) and tendon(s) of the rotator cuff of right shoulder, subsequent encounter: Secondary | ICD-10-CM | POA: Diagnosis not present

## 2023-04-17 DIAGNOSIS — G5601 Carpal tunnel syndrome, right upper limb: Secondary | ICD-10-CM | POA: Diagnosis not present

## 2023-04-18 LAB — LIPID PANEL
Chol/HDL Ratio: 2.8 ratio (ref 0.0–5.0)
Cholesterol, Total: 157 mg/dL (ref 100–199)
HDL: 57 mg/dL (ref >39)
LDL Chol Calc (NIH): 78 mg/dL (ref 0–99)
Triglycerides: 127 mg/dL (ref 0–149)
VLDL Cholesterol Cal: 22 mg/dL (ref 5–40)

## 2023-04-18 LAB — LEAD, BLOOD (ADULT >= 16 YRS): Lead-Whole Blood: 5 ug/dL — ABNORMAL HIGH (ref 0.0–3.4)

## 2023-04-18 LAB — CMP14+EGFR
ALT: 13 IU/L (ref 0–44)
AST: 10 IU/L (ref 0–40)
Albumin/Globulin Ratio: 2.3
Albumin: 4.3 g/dL (ref 3.9–4.9)
Alkaline Phosphatase: 114 IU/L (ref 44–121)
BUN/Creatinine Ratio: 14 (ref 10–24)
BUN: 16 mg/dL (ref 8–27)
Bilirubin Total: 0.5 mg/dL (ref 0.0–1.2)
CO2: 25 mmol/L (ref 20–29)
Calcium: 9.8 mg/dL (ref 8.6–10.2)
Chloride: 99 mmol/L (ref 96–106)
Creatinine, Ser: 1.15 mg/dL (ref 0.76–1.27)
Globulin, Total: 1.9 g/dL (ref 1.5–4.5)
Glucose: 96 mg/dL (ref 70–99)
Potassium: 4.6 mmol/L (ref 3.5–5.2)
Sodium: 136 mmol/L (ref 134–144)
Total Protein: 6.2 g/dL (ref 6.0–8.5)
eGFR: 70 mL/min/{1.73_m2} (ref >59)

## 2023-04-18 LAB — CBC WITH DIFFERENTIAL/PLATELET
Basophils Absolute: 0 10*3/uL (ref 0.0–0.2)
Basos: 1 %
EOS (ABSOLUTE): 0.2 10*3/uL (ref 0.0–0.4)
Eos: 3 %
Hematocrit: 42.3 % (ref 37.5–51.0)
Hemoglobin: 14.3 g/dL (ref 13.0–17.7)
Immature Grans (Abs): 0 10*3/uL (ref 0.0–0.1)
Immature Granulocytes: 0 %
Lymphocytes Absolute: 2.9 10*3/uL (ref 0.7–3.1)
Lymphs: 39 %
MCH: 30.4 pg (ref 26.6–33.0)
MCHC: 33.8 g/dL (ref 31.5–35.7)
MCV: 90 fL (ref 79–97)
Monocytes Absolute: 0.8 10*3/uL (ref 0.1–0.9)
Monocytes: 11 %
Neutrophils Absolute: 3.4 10*3/uL (ref 1.4–7.0)
Neutrophils: 46 %
Platelets: 274 10*3/uL (ref 150–450)
RBC: 4.71 x10E6/uL (ref 4.14–5.80)
RDW: 14.2 % (ref 11.6–15.4)
WBC: 7.3 10*3/uL (ref 3.4–10.8)

## 2023-04-22 ENCOUNTER — Other Ambulatory Visit: Payer: Self-pay | Admitting: Family Medicine

## 2023-04-22 DIAGNOSIS — G2581 Restless legs syndrome: Secondary | ICD-10-CM

## 2023-05-16 DIAGNOSIS — G5601 Carpal tunnel syndrome, right upper limb: Secondary | ICD-10-CM | POA: Diagnosis not present

## 2023-05-26 DIAGNOSIS — G5601 Carpal tunnel syndrome, right upper limb: Secondary | ICD-10-CM | POA: Diagnosis not present

## 2023-06-09 ENCOUNTER — Encounter: Payer: Self-pay | Admitting: Family Medicine

## 2023-06-09 ENCOUNTER — Ambulatory Visit (INDEPENDENT_AMBULATORY_CARE_PROVIDER_SITE_OTHER): Payer: Medicare HMO | Admitting: Family Medicine

## 2023-06-09 VITALS — BP 125/82 | HR 71 | Ht 70.0 in | Wt 194.0 lb

## 2023-06-09 DIAGNOSIS — E119 Type 2 diabetes mellitus without complications: Secondary | ICD-10-CM

## 2023-06-09 DIAGNOSIS — E1169 Type 2 diabetes mellitus with other specified complication: Secondary | ICD-10-CM

## 2023-06-09 DIAGNOSIS — I152 Hypertension secondary to endocrine disorders: Secondary | ICD-10-CM

## 2023-06-09 DIAGNOSIS — E1159 Type 2 diabetes mellitus with other circulatory complications: Secondary | ICD-10-CM

## 2023-06-09 DIAGNOSIS — F320 Major depressive disorder, single episode, mild: Secondary | ICD-10-CM | POA: Diagnosis not present

## 2023-06-09 DIAGNOSIS — E785 Hyperlipidemia, unspecified: Secondary | ICD-10-CM

## 2023-06-09 DIAGNOSIS — Z7984 Long term (current) use of oral hypoglycemic drugs: Secondary | ICD-10-CM

## 2023-06-09 LAB — BAYER DCA HB A1C WAIVED: HB A1C (BAYER DCA - WAIVED): 7.2 % — ABNORMAL HIGH (ref 4.8–5.6)

## 2023-06-09 MED ORDER — LISINOPRIL-HYDROCHLOROTHIAZIDE 20-25 MG PO TABS: 0.5000 | ORAL_TABLET | Freq: Every day | ORAL | 3 refills | Status: DC

## 2023-06-09 MED ORDER — BUPROPION HCL ER (XL) 300 MG PO TB24: 300.0000 mg | ORAL_TABLET | Freq: Every day | ORAL | 3 refills | Status: DC

## 2023-06-09 NOTE — Progress Notes (Signed)
BP 125/82   Pulse 71   Ht 5\' 10"  (1.778 m)   Wt 194 lb (88 kg)   SpO2 97%   BMI 27.84 kg/m    Subjective:   Patient ID: Sean Price, male    DOB: Apr 06, 1956, 67 y.o.   MRN: [id number]  HPI: Sean Price is a 67 y.o. male presenting on 06/09/2023 for Medical Management of Chronic Issues, Diabetes, and Gastroesophageal Reflux   HPI Type 2 diabetes mellitus Patient comes in today for recheck of his diabetes. Patient has been currently taking metformin and glipizide. Patient is currently on an ACE inhibitor/ARB. Patient has not seen an ophthalmologist this year. Patient denies any new issues with their feet. The symptom started onset as an adult hypertension and hyperlipidemia ARE RELATED TO DM   Hypertension Patient is currently on lisinopril hydrochlorothiazide, and their blood pressure today is 125/82. Patient denies any lightheadedness or dizziness. Patient denies headaches, blurred vision, chest pains, shortness of breath, or weakness. Denies any side effects from medication and is content with current medication.   Hyperlipidemia Patient is coming in for recheck of his hyperlipidemia. The patient is currently taking Crestor. They deny any issues with myalgias or history of liver damage from it. They deny any focal numbness or weakness or chest pain.   Depression recheck Patient is coming in today for depression recheck.  He currently has Wellbutrin and Abilify and Paxil.  He denies any suicidal ideations, denies any major issues.  He feels like he is doing very well on the medication and feels like everything keeps under control.  He still does have some restless leg but it is improving.    06/09/2023   10:03 AM 04/14/2023   11:12 AM 01/12/2023    8:24 AM 12/12/2022    9:30 AM 10/12/2022    7:58 AM  Depression screen PHQ 2/9  Decreased Interest 0 0 1 2 2   Down, Depressed, Hopeless 0 2 2 1 1   PHQ - 2 Score 0 2 3 3 3   Altered sleeping 3 0 3 3 3   Tired, decreased energy  0 0 1 1 1   Change in appetite 0 0 3 1 1   Feeling bad or failure about yourself  0 3 1 1 1   Trouble concentrating 2 3 1  0 0  Moving slowly or fidgety/restless 3 3 1  0 0  Suicidal thoughts 0 2 3 0 0  PHQ-9 Score 8 13 16 9 9   Difficult doing work/chores Somewhat difficult Somewhat difficult Somewhat difficult Not difficult at all Not difficult at all     Relevant past medical, surgical, family and social history reviewed and updated as indicated. Interim medical history since our last visit reviewed. Allergies and medications reviewed and updated.  Review of Systems  Constitutional:  Negative for chills and fever.  Eyes:  Negative for visual disturbance.  Respiratory:  Negative for shortness of breath and wheezing.   Cardiovascular:  Negative for chest pain and leg swelling.  Musculoskeletal:  Negative for back pain and gait problem.  Skin:  Negative for rash.  Neurological:  Negative for dizziness, weakness, light-headedness and numbness.  Psychiatric/Behavioral:  Positive for sleep disturbance (Restless legs). Negative for dysphoric mood, self-injury and suicidal ideas. The patient is not nervous/anxious.   All other systems reviewed and are negative.   Per HPI unless specifically indicated above   Allergies as of 06/09/2023   No Known Allergies      Medication List  Accurate as of June 09, 2023 10:33 AM. If you have any questions, ask your nurse or doctor.          ARIPiprazole 5 MG tablet Commonly known as: Abilify Take 1 tablet (5 mg total) by mouth daily.   BLOOD GLUCOSE TEST STRIPS Strp 1 each by In Vitro route 2 (two) times daily.   buPROPion 300 MG 24 hr tablet Commonly known as: WELLBUTRIN XL Take 1 tablet (300 mg total) by mouth daily. TAKE 1 TABLET BY MOUTH ONCE DAILY   glipiZIDE 5 MG tablet Commonly known as: GLUCOTROL Take 1 tablet (5 mg total) by mouth 2 (two) times daily before a meal.   glucose 5 g chewable tablet Chew 15 g by mouth as  needed for low blood sugar.   HYDROcodone-acetaminophen 5-325 MG tablet Commonly known as: NORCO/VICODIN Take 1 tablet by mouth every 6 (six) hours as needed.   lisinopril-hydrochlorothiazide 20-25 MG tablet Commonly known as: ZESTORETIC Take 1/2 (one-half) tablet by mouth once daily   metFORMIN 500 MG tablet Commonly known as: GLUCOPHAGE Take 1 tablet (500 mg total) by mouth 2 (two) times daily with a meal.   onetouch ultrasoft lancets Use as instructed   pantoprazole 40 MG tablet Commonly known as: PROTONIX Take 1 tablet by mouth once daily   PARoxetine 20 MG tablet Commonly known as: PAXIL Take 1 tablet (20 mg total) by mouth daily.   pramipexole 0.5 MG tablet Commonly known as: MIRAPEX Take 1 tablet (0.5 mg total) by mouth 3 (three) times daily.   rosuvastatin 10 MG tablet Commonly known as: CRESTOR Take 1 tablet by mouth once daily   tiZANidine 4 MG tablet Commonly known as: ZANAFLEX         Objective:   BP 125/82   Pulse 71   Ht 5\' 10"  (1.778 m)   Wt 194 lb (88 kg)   SpO2 97%   BMI 27.84 kg/m   Wt Readings from Last 3 Encounters:  06/09/23 194 lb (88 kg)  04/14/23 185 lb (83.9 kg)  01/12/23 195 lb (88.5 kg)    Physical Exam Vitals and nursing note reviewed.  Constitutional:      General: He is not in acute distress.    Appearance: He is well-developed. He is not diaphoretic.  Eyes:     General: No scleral icterus.    Conjunctiva/sclera: Conjunctivae normal.  Neck:     Thyroid: No thyromegaly.  Cardiovascular:     Rate and Rhythm: Normal rate and regular rhythm.     Heart sounds: Normal heart sounds. No murmur heard. Pulmonary:     Effort: Pulmonary effort is normal. No respiratory distress.     Breath sounds: Normal breath sounds. No wheezing.  Musculoskeletal:        General: No swelling. Normal range of motion.     Cervical back: Neck supple.  Lymphadenopathy:     Cervical: No cervical adenopathy.  Skin:    General: Skin is warm and  dry.     Findings: No rash.  Neurological:     Mental Status: He is alert and oriented to person, place, and time.     Coordination: Coordination normal.  Psychiatric:        Behavior: Behavior normal.       Assessment & Plan:   Problem List Items Addressed This Visit       Cardiovascular and Mediastinum   Hypertension associated with diabetes (HCC)     Endocrine   Type 2 diabetes  mellitus without complications (HCC) - Primary   Relevant Orders   Bayer DCA Hb A1c Waived   Hyperlipidemia associated with type 2 diabetes mellitus (HCC)     Other   Depression, major, single episode, mild (HCC)    A1c 7.2, up just slightly, focus on diet.  Otherwise no changes in medicine, seems to be doing well Follow up plan: Return in about 3 months (around 09/09/2023), or if symptoms worsen or fail to improve, for Diabetes recheck.  Counseling provided for all of the vaccine components Orders Placed This Encounter  Procedures   Bayer DCA Hb A1c Waived    Arville Care, MD [facility] 06/09/2023, 10:33 AM

## 2023-06-12 ENCOUNTER — Ambulatory Visit: Payer: Medicare HMO

## 2023-06-15 ENCOUNTER — Encounter: Payer: Self-pay | Admitting: Family Medicine

## 2023-07-01 ENCOUNTER — Other Ambulatory Visit: Payer: Self-pay | Admitting: Family Medicine

## 2023-07-01 DIAGNOSIS — F320 Major depressive disorder, single episode, mild: Secondary | ICD-10-CM

## 2023-07-01 DIAGNOSIS — F310 Bipolar disorder, current episode hypomanic: Secondary | ICD-10-CM

## 2023-09-14 ENCOUNTER — Encounter: Payer: Self-pay | Admitting: Family Medicine

## 2023-09-14 ENCOUNTER — Ambulatory Visit (INDEPENDENT_AMBULATORY_CARE_PROVIDER_SITE_OTHER): Payer: Medicare HMO | Admitting: Family Medicine

## 2023-09-14 VITALS — BP 158/87 | HR 77 | Ht 70.0 in | Wt 195.0 lb

## 2023-09-14 DIAGNOSIS — E1169 Type 2 diabetes mellitus with other specified complication: Secondary | ICD-10-CM

## 2023-09-14 DIAGNOSIS — F310 Bipolar disorder, current episode hypomanic: Secondary | ICD-10-CM | POA: Diagnosis not present

## 2023-09-14 DIAGNOSIS — F320 Major depressive disorder, single episode, mild: Secondary | ICD-10-CM

## 2023-09-14 DIAGNOSIS — E1159 Type 2 diabetes mellitus with other circulatory complications: Secondary | ICD-10-CM

## 2023-09-14 DIAGNOSIS — E119 Type 2 diabetes mellitus without complications: Secondary | ICD-10-CM | POA: Diagnosis not present

## 2023-09-14 DIAGNOSIS — E785 Hyperlipidemia, unspecified: Secondary | ICD-10-CM

## 2023-09-14 DIAGNOSIS — Z77011 Contact with and (suspected) exposure to lead: Secondary | ICD-10-CM | POA: Diagnosis not present

## 2023-09-14 DIAGNOSIS — I152 Hypertension secondary to endocrine disorders: Secondary | ICD-10-CM

## 2023-09-14 DIAGNOSIS — K219 Gastro-esophageal reflux disease without esophagitis: Secondary | ICD-10-CM | POA: Diagnosis not present

## 2023-09-14 LAB — BAYER DCA HB A1C WAIVED: HB A1C (BAYER DCA - WAIVED): 6.3 % — ABNORMAL HIGH (ref 4.8–5.6)

## 2023-09-14 MED ORDER — PANTOPRAZOLE SODIUM 40 MG PO TBEC: 40.0000 mg | DELAYED_RELEASE_TABLET | Freq: Every day | ORAL | 3 refills | Status: DC

## 2023-09-14 MED ORDER — ARIPIPRAZOLE 5 MG PO TABS: 5.0000 mg | ORAL_TABLET | Freq: Every day | ORAL | 3 refills | Status: DC

## 2023-09-14 MED ORDER — ROSUVASTATIN CALCIUM 10 MG PO TABS: 10.0000 mg | ORAL_TABLET | Freq: Every day | ORAL | 3 refills | Status: DC

## 2023-09-14 NOTE — Progress Notes (Signed)
BP (!) 158/87   Pulse 77   Ht 5\' 10"  (1.778 m)   Wt 195 lb (88.5 kg)   SpO2 98%   BMI 27.98 kg/m    Subjective:   Patient ID: Sean Price, male    DOB: July 15, 1956, 67 y.o.   MRN: [id number]  HPI: Sean Price is a 67 y.o. male presenting on 09/14/2023 for Medical Management of Chronic Issues, Diabetes, Hyperlipidemia, Hypertension, and lead exposure   HPI Type 2 diabetes mellitus Patient comes in today for recheck of his diabetes. Patient has been currently taking metformin and glipizide. Patient is currently on an ACE inhibitor/ARB. Patient has seen an ophthalmologist this year. Patient denies any new issues with their feet. The symptom started onset as an adult htn and hld ARE RELATED TO DM   Hypertension Patient is currently on lisinopril, and their blood pressure today is 158/87. Patient denies any lightheadedness or dizziness. Patient denies headaches, blurred vision, chest pains, shortness of breath, or weakness. Denies any side effects from medication and is content with current medication.   Hyperlipidemia Patient is coming in for recheck of his hyperlipidemia. The patient is currently taking Crestor. They deny any issues with myalgias or history of liver damage from it. They deny any focal numbness or weakness or chest pain.   Elevated blood blood levels with exposure Patient has done some painting in an older house and has had some elevated blood level of blood and wants to get it checked again.  Depression recheck Patient is coming in for depression recheck.  He currently takes Wellbutrin and Abilify and Paxil.  He feels like he is doing well.  He says his medicine is working well for his mood and he feels like he can get to sleep.  He does not always stay asleep but he can get to sleep and he is getting around 7 hours throughout the day between nighttime and naps during the daytime.    09/14/2023    9:55 AM 06/09/2023   10:03 AM 04/14/2023   11:12 AM  01/12/2023    8:24 AM 12/12/2022    9:30 AM  Depression screen PHQ 2/9  Decreased Interest 1 0 0 1 2  Down, Depressed, Hopeless 1 0 2 2 1   PHQ - 2 Score 2 0 2 3 3   Altered sleeping 3 3 0 3 3  Tired, decreased energy 0 0 0 1 1  Change in appetite 0 0 0 3 1  Feeling bad or failure about yourself  0 0 3 1 1   Trouble concentrating 2 2 3 1  0  Moving slowly or fidgety/restless 0 3 3 1  0  Suicidal thoughts 0 0 2 3 0  PHQ-9 Score 7 8 13 16 9   Difficult doing work/chores Not difficult at all Somewhat difficult Somewhat difficult Somewhat difficult Not difficult at all     Relevant past medical, surgical, family and social history reviewed and updated as indicated. Interim medical history since our last visit reviewed. Allergies and medications reviewed and updated.  Review of Systems  Constitutional:  Negative for chills and fever.  Eyes:  Negative for visual disturbance.  Respiratory:  Negative for shortness of breath and wheezing.   Cardiovascular:  Negative for chest pain and leg swelling.  Skin:  Negative for rash.  Neurological:  Negative for dizziness, weakness and light-headedness.  Psychiatric/Behavioral:  Positive for sleep disturbance. Negative for dysphoric mood, self-injury and suicidal ideas. The patient is not nervous/anxious.   All other  systems reviewed and are negative.   Per HPI unless specifically indicated above   Allergies as of 09/14/2023   No Known Allergies      Medication List        Accurate as of September 14, 2023 10:28 AM. If you have any questions, ask your nurse or doctor.          ARIPiprazole 5 MG tablet Commonly known as: ABILIFY Take 1 tablet (5 mg total) by mouth daily.   BLOOD GLUCOSE TEST STRIPS Strp 1 each by In Vitro route 2 (two) times daily.   buPROPion 300 MG 24 hr tablet Commonly known as: WELLBUTRIN XL Take 1 tablet (300 mg total) by mouth daily. TAKE 1 TABLET BY MOUTH ONCE DAILY   glipiZIDE 5 MG tablet Commonly known as:  GLUCOTROL Take 1 tablet (5 mg total) by mouth 2 (two) times daily before a meal.   glucose 5 g chewable tablet Chew 15 g by mouth as needed for low blood sugar.   HYDROcodone-acetaminophen 5-325 MG tablet Commonly known as: NORCO/VICODIN Take 1 tablet by mouth every 6 (six) hours as needed.   lisinopril-hydrochlorothiazide 20-25 MG tablet Commonly known as: ZESTORETIC Take 0.5 tablets by mouth daily.   metFORMIN 500 MG tablet Commonly known as: GLUCOPHAGE Take 1 tablet (500 mg total) by mouth 2 (two) times daily with a meal.   onetouch ultrasoft lancets Use as instructed   pantoprazole 40 MG tablet Commonly known as: PROTONIX Take 1 tablet (40 mg total) by mouth daily.   PARoxetine 20 MG tablet Commonly known as: PAXIL Take 1 tablet (20 mg total) by mouth daily.   pramipexole 0.5 MG tablet Commonly known as: MIRAPEX Take 1 tablet (0.5 mg total) by mouth 3 (three) times daily.   rosuvastatin 10 MG tablet Commonly known as: CRESTOR Take 1 tablet (10 mg total) by mouth daily.   tiZANidine 4 MG tablet Commonly known as: ZANAFLEX         Objective:   BP (!) 158/87   Pulse 77   Ht 5\' 10"  (1.778 m)   Wt 195 lb (88.5 kg)   SpO2 98%   BMI 27.98 kg/m   Wt Readings from Last 3 Encounters:  09/14/23 195 lb (88.5 kg)  06/09/23 194 lb (88 kg)  04/14/23 185 lb (83.9 kg)    Physical Exam Vitals and nursing note reviewed.  Constitutional:      General: He is not in acute distress.    Appearance: He is well-developed. He is not diaphoretic.  Eyes:     General: No scleral icterus.    Conjunctiva/sclera: Conjunctivae normal.  Neck:     Thyroid: No thyromegaly.  Cardiovascular:     Rate and Rhythm: Normal rate and regular rhythm.     Heart sounds: Normal heart sounds. No murmur heard. Pulmonary:     Effort: Pulmonary effort is normal. No respiratory distress.     Breath sounds: Normal breath sounds. No wheezing.  Musculoskeletal:        General: No swelling.  Normal range of motion.     Cervical back: Neck supple.  Lymphadenopathy:     Cervical: No cervical adenopathy.  Skin:    General: Skin is warm and dry.     Findings: No rash.  Neurological:     Mental Status: He is alert and oriented to person, place, and time.     Coordination: Coordination normal.  Psychiatric:        Behavior: Behavior normal.  Diabetic Foot Exam - Simple   Simple Foot Form Diabetic Foot exam was performed with the following findings: Yes 09/14/2023 10:26 AM  Visual Inspection No deformities, no ulcerations, no other skin breakdown bilaterally: Yes Sensation Testing Intact to touch and monofilament testing bilaterally: Yes Pulse Check Posterior Tibialis and Dorsalis pulse intact bilaterally: Yes Comments      Assessment & Plan:   Problem List Items Addressed This Visit       Cardiovascular and Mediastinum   Hypertension associated with diabetes (HCC)   Relevant Medications   rosuvastatin (CRESTOR) 10 MG tablet   Other Relevant Orders   CBC with Differential/Platelet   CMP14+EGFR   Lipid panel   Bayer DCA Hb A1c Waived     Endocrine   Type 2 diabetes mellitus without complications (HCC) - Primary   Relevant Medications   rosuvastatin (CRESTOR) 10 MG tablet   Other Relevant Orders   CBC with Differential/Platelet   CMP14+EGFR   Lipid panel   Bayer DCA Hb A1c Waived   Microalbumin / creatinine urine ratio   Hyperlipidemia associated with type 2 diabetes mellitus (HCC)   Relevant Medications   rosuvastatin (CRESTOR) 10 MG tablet   Other Relevant Orders   CBC with Differential/Platelet   CMP14+EGFR   Lipid panel   Bayer DCA Hb A1c Waived     Other   Depression, major, single episode, mild (HCC)   Relevant Medications   ARIPiprazole (ABILIFY) 5 MG tablet   Other Visit Diagnoses     Lead exposure       Relevant Orders   Lead, blood   Bipolar affective disorder, current episode hypomanic (HCC)       Relevant Medications    ARIPiprazole (ABILIFY) 5 MG tablet   Gastroesophageal reflux disease without esophagitis       Relevant Medications   pantoprazole (PROTONIX) 40 MG tablet       A1c looks good at 6.3.  Seems to be doing well.  No major changes.  His blood pressure was elevated today and instructed him to keep close eye on it at home but no adjustments on medication today.  He says it usually runs better at home Follow up plan: Return in about 3 months (around 12/15/2023), or if symptoms worsen or fail to improve, for Diabetes and hypertension.  Counseling provided for all of the vaccine components Orders Placed This Encounter  Procedures   CBC with Differential/Platelet   CMP14+EGFR   Lipid panel   Bayer DCA Hb A1c Waived   Lead, blood   Microalbumin / creatinine urine ratio    Arville Care, MD [facility] 09/14/2023, 10:28 AM

## 2023-09-15 LAB — MICROALBUMIN / CREATININE URINE RATIO
Creatinine, Urine: 22.8 mg/dL
Microalb/Creat Ratio: <13 mg/g{creat} (ref 0–29)
Microalbumin, Urine: <3 ug/mL

## 2023-09-16 LAB — LIPID PANEL
Chol/HDL Ratio: 2.7 ratio (ref 0.0–5.0)
Cholesterol, Total: 155 mg/dL (ref 100–199)
HDL: 58 mg/dL (ref >39)
LDL Chol Calc (NIH): 84 mg/dL (ref 0–99)
Triglycerides: 63 mg/dL (ref 0–149)
VLDL Cholesterol Cal: 13 mg/dL (ref 5–40)

## 2023-09-16 LAB — CMP14+EGFR
ALT: 17 IU/L (ref 0–44)
AST: 18 IU/L (ref 0–40)
Albumin: 4.4 g/dL (ref 3.9–4.9)
Alkaline Phosphatase: 101 IU/L (ref 44–121)
BUN/Creatinine Ratio: 13 (ref 10–24)
BUN: 14 mg/dL (ref 8–27)
Bilirubin Total: 0.4 mg/dL (ref 0.0–1.2)
CO2: 24 mmol/L (ref 20–29)
Calcium: 9.8 mg/dL (ref 8.6–10.2)
Chloride: 102 mmol/L (ref 96–106)
Creatinine, Ser: 1.1 mg/dL (ref 0.76–1.27)
Globulin, Total: 2.1 g/dL (ref 1.5–4.5)
Glucose: 92 mg/dL (ref 70–99)
Potassium: 4.5 mmol/L (ref 3.5–5.2)
Sodium: 139 mmol/L (ref 134–144)
Total Protein: 6.5 g/dL (ref 6.0–8.5)
eGFR: 74 mL/min/{1.73_m2} (ref >59)

## 2023-09-16 LAB — CBC WITH DIFFERENTIAL/PLATELET
Basophils Absolute: 0.1 10*3/uL (ref 0.0–0.2)
Basos: 1 %
EOS (ABSOLUTE): 0.3 10*3/uL (ref 0.0–0.4)
Eos: 5 %
Hematocrit: 43.3 % (ref 37.5–51.0)
Hemoglobin: 14.7 g/dL (ref 13.0–17.7)
Immature Grans (Abs): 0 10*3/uL (ref 0.0–0.1)
Immature Granulocytes: 0 %
Lymphocytes Absolute: 2.5 10*3/uL (ref 0.7–3.1)
Lymphs: 39 %
MCH: 30.8 pg (ref 26.6–33.0)
MCHC: 33.9 g/dL (ref 31.5–35.7)
MCV: 91 fL (ref 79–97)
Monocytes Absolute: 0.6 10*3/uL (ref 0.1–0.9)
Monocytes: 9 %
Neutrophils Absolute: 2.9 10*3/uL (ref 1.4–7.0)
Neutrophils: 46 %
Platelets: 260 10*3/uL (ref 150–450)
RBC: 4.78 x10E6/uL (ref 4.14–5.80)
RDW: 13.8 % (ref 11.6–15.4)
WBC: 6.3 10*3/uL (ref 3.4–10.8)

## 2023-09-16 LAB — LEAD, BLOOD (ADULT >= 16 YRS): Lead-Whole Blood: 4.4 ug/dL — ABNORMAL HIGH (ref 0.0–3.4)

## 2023-09-18 ENCOUNTER — Telehealth: Payer: Self-pay | Admitting: Family Medicine

## 2023-09-20 ENCOUNTER — Ambulatory Visit: Payer: Medicare HMO

## 2023-09-20 DIAGNOSIS — E119 Type 2 diabetes mellitus without complications: Secondary | ICD-10-CM | POA: Diagnosis not present

## 2023-09-20 LAB — HM DIABETES EYE EXAM

## 2023-09-20 NOTE — Progress Notes (Signed)
Sean Price arrived 09/20/2023 and has given verbal consent to obtain images and complete their overdue diabetic retinal screening.  The images have been sent to an ophthalmologist or optometrist for review and interpretation.  Results will be sent back to Dettinger, Elige Radon, MD for review.  Patient has been informed they will be contacted when we receive the results via telephone or MyChart

## 2023-10-16 DIAGNOSIS — F411 Generalized anxiety disorder: Secondary | ICD-10-CM | POA: Diagnosis not present

## 2023-10-16 DIAGNOSIS — E1122 Type 2 diabetes mellitus with diabetic chronic kidney disease: Secondary | ICD-10-CM | POA: Diagnosis not present

## 2023-10-16 DIAGNOSIS — Z8249 Family history of ischemic heart disease and other diseases of the circulatory system: Secondary | ICD-10-CM | POA: Diagnosis not present

## 2023-10-16 DIAGNOSIS — G2581 Restless legs syndrome: Secondary | ICD-10-CM | POA: Diagnosis not present

## 2023-10-16 DIAGNOSIS — I129 Hypertensive chronic kidney disease with stage 1 through stage 4 chronic kidney disease, or unspecified chronic kidney disease: Secondary | ICD-10-CM | POA: Diagnosis not present

## 2023-10-16 DIAGNOSIS — Z7984 Long term (current) use of oral hypoglycemic drugs: Secondary | ICD-10-CM | POA: Diagnosis not present

## 2023-10-16 DIAGNOSIS — N182 Chronic kidney disease, stage 2 (mild): Secondary | ICD-10-CM | POA: Diagnosis not present

## 2023-10-16 DIAGNOSIS — M5430 Sciatica, unspecified side: Secondary | ICD-10-CM | POA: Diagnosis not present

## 2023-10-16 DIAGNOSIS — E785 Hyperlipidemia, unspecified: Secondary | ICD-10-CM | POA: Diagnosis not present

## 2023-10-16 DIAGNOSIS — K219 Gastro-esophageal reflux disease without esophagitis: Secondary | ICD-10-CM | POA: Diagnosis not present

## 2023-10-16 DIAGNOSIS — F325 Major depressive disorder, single episode, in full remission: Secondary | ICD-10-CM | POA: Diagnosis not present

## 2023-10-16 DIAGNOSIS — Z87891 Personal history of nicotine dependence: Secondary | ICD-10-CM | POA: Diagnosis not present

## 2023-10-16 DIAGNOSIS — Z008 Encounter for other general examination: Secondary | ICD-10-CM | POA: Diagnosis not present

## 2023-12-18 ENCOUNTER — Ambulatory Visit (INDEPENDENT_AMBULATORY_CARE_PROVIDER_SITE_OTHER): Payer: Medicare HMO | Admitting: Family Medicine

## 2023-12-18 ENCOUNTER — Encounter: Payer: Self-pay | Admitting: Family Medicine

## 2023-12-18 VITALS — BP 144/81 | HR 69 | Ht 70.0 in | Wt 198.0 lb

## 2023-12-18 DIAGNOSIS — I152 Hypertension secondary to endocrine disorders: Secondary | ICD-10-CM | POA: Diagnosis not present

## 2023-12-18 DIAGNOSIS — E1169 Type 2 diabetes mellitus with other specified complication: Secondary | ICD-10-CM | POA: Diagnosis not present

## 2023-12-18 DIAGNOSIS — Z7984 Long term (current) use of oral hypoglycemic drugs: Secondary | ICD-10-CM | POA: Diagnosis not present

## 2023-12-18 DIAGNOSIS — E1159 Type 2 diabetes mellitus with other circulatory complications: Secondary | ICD-10-CM | POA: Diagnosis not present

## 2023-12-18 DIAGNOSIS — E119 Type 2 diabetes mellitus without complications: Secondary | ICD-10-CM | POA: Diagnosis not present

## 2023-12-18 DIAGNOSIS — E785 Hyperlipidemia, unspecified: Secondary | ICD-10-CM | POA: Diagnosis not present

## 2023-12-18 DIAGNOSIS — F320 Major depressive disorder, single episode, mild: Secondary | ICD-10-CM | POA: Diagnosis not present

## 2023-12-18 DIAGNOSIS — N529 Male erectile dysfunction, unspecified: Secondary | ICD-10-CM | POA: Diagnosis not present

## 2023-12-18 LAB — BAYER DCA HB A1C WAIVED: HB A1C (BAYER DCA - WAIVED): 7.1 % — ABNORMAL HIGH (ref 4.8–5.6)

## 2023-12-18 MED ORDER — SILDENAFIL CITRATE 100 MG PO TABS: 100.0000 mg | ORAL_TABLET | Freq: Every day | ORAL | 1 refills | Status: AC | PRN

## 2023-12-18 NOTE — Progress Notes (Signed)
BP (!) 144/81   Pulse 69   Ht 5\' 10"  (1.778 m)   Wt 198 lb (89.8 kg)   SpO2 98%   BMI 28.41 kg/m    Subjective:   Patient ID: Sean MCCLIMANS, male    DOB: Dec 25, 1955, 68 y.o.   MRN: [id number]  HPI: Sean Price is a 68 y.o. male presenting on 12/18/2023 for Medical Management of Chronic Issues, Diabetes, Gastroesophageal Reflux, Hyperlipidemia, and Hypertension   HPI Type 2 diabetes mellitus Patient comes in today for recheck of his diabetes. Patient has been currently taking metformin and glipizide. Patient is currently on an ACE inhibitor/ARB. Patient has not seen an ophthalmologist this year. Patient denies any new issues with their feet. The symptom started onset as an adult hypertension and hyperlipidemia ARE RELATED TO DM   Hypertension Patient is currently on lisinopril-hydrochlorothiazide, and their blood pressure today is 144/81, he says it runs good at home. Patient denies any lightheadedness or dizziness. Patient denies headaches, blurred vision, chest pains, shortness of breath, or weakness. Denies any side effects from medication and is content with current medication.   Hyperlipidemia Patient is coming in for recheck of his hyperlipidemia. The patient is currently taking Crestor. They deny any issues with myalgias or history of liver damage from it. They deny any focal numbness or weakness or chest pain.   GERD Patient is currently on pantoprazole.  She denies any major symptoms or abdominal pain or belching or burping. She denies any blood in her stool or lightheadedness or dizziness.  Erectile dysfunction Patient is coming in today for erectile dysfunction.  He says has been having some issues with it off and on and wonders if there is something he can take to come to help with it.  He has never taken any other medicines before.  Depression and anxiety Patient is coming in today for depression and anxiety recheck.  He currently takes Wellbutrin and Abilify  and Paxil.  He feels like he is in a good place, he has a little bit of winter blues especially with the cold weather that been happening recently but he says aside from that he is feeling pretty good about it and denies any suicidal ideations or thoughts of hurting self.    09/14/2023    9:55 AM 06/09/2023   10:03 AM 04/14/2023   11:12 AM 01/12/2023    8:24 AM 12/12/2022    9:30 AM  Depression screen PHQ 2/9  Decreased Interest 1 0 0 1 2  Down, Depressed, Hopeless 1 0 2 2 1   PHQ - 2 Score 2 0 2 3 3   Altered sleeping 3 3 0 3 3  Tired, decreased energy 0 0 0 1 1  Change in appetite 0 0 0 3 1  Feeling bad or failure about yourself  0 0 3 1 1   Trouble concentrating 2 2 3 1  0  Moving slowly or fidgety/restless 0 3 3 1  0  Suicidal thoughts 0 0 2 3 0  PHQ-9 Score 7 8 13 16 9   Difficult doing work/chores Not difficult at all Somewhat difficult Somewhat difficult Somewhat difficult Not difficult at all     Relevant past medical, surgical, family and social history reviewed and updated as indicated. Interim medical history since our last visit reviewed. Allergies and medications reviewed and updated.  Review of Systems  Constitutional:  Negative for chills and fever.  Eyes:  Negative for visual disturbance.  Respiratory:  Negative for shortness of breath and  wheezing.   Cardiovascular:  Negative for chest pain and leg swelling.  Musculoskeletal:  Negative for back pain and gait problem.  Skin:  Negative for rash.  Neurological:  Negative for dizziness, weakness and light-headedness.  All other systems reviewed and are negative.   Per HPI unless specifically indicated above   Allergies as of 12/18/2023   No Known Allergies      Medication List        Accurate as of December 18, 2023 10:51 AM. If you have any questions, ask your nurse or doctor.          ARIPiprazole 5 MG tablet Commonly known as: ABILIFY Take 1 tablet (5 mg total) by mouth daily.   BLOOD GLUCOSE TEST STRIPS  Strp 1 each by In Vitro route 2 (two) times daily.   buPROPion 300 MG 24 hr tablet Commonly known as: WELLBUTRIN XL Take 1 tablet (300 mg total) by mouth daily. TAKE 1 TABLET BY MOUTH ONCE DAILY   glipiZIDE 5 MG tablet Commonly known as: GLUCOTROL Take 1 tablet (5 mg total) by mouth 2 (two) times daily before a meal.   glucose 5 g chewable tablet Chew 15 g by mouth as needed for low blood sugar.   HYDROcodone-acetaminophen 5-325 MG tablet Commonly known as: NORCO/VICODIN Take 1 tablet by mouth every 6 (six) hours as needed.   lisinopril-hydrochlorothiazide 20-25 MG tablet Commonly known as: ZESTORETIC Take 0.5 tablets by mouth daily.   metFORMIN 500 MG tablet Commonly known as: GLUCOPHAGE Take 1 tablet (500 mg total) by mouth 2 (two) times daily with a meal.   onetouch ultrasoft lancets Use as instructed   pantoprazole 40 MG tablet Commonly known as: PROTONIX Take 1 tablet (40 mg total) by mouth daily.   PARoxetine 20 MG tablet Commonly known as: PAXIL Take 1 tablet (20 mg total) by mouth daily.   pramipexole 0.5 MG tablet Commonly known as: MIRAPEX Take 1 tablet (0.5 mg total) by mouth 3 (three) times daily.   rosuvastatin 10 MG tablet Commonly known as: CRESTOR Take 1 tablet (10 mg total) by mouth daily.   sildenafil 100 MG tablet Commonly known as: Viagra Take 1 tablet (100 mg total) by mouth daily as needed for erectile dysfunction. Started by: Elige Radon Kaelyn Nauta   tiZANidine 4 MG tablet Commonly known as: ZANAFLEX         Objective:   BP (!) 144/81   Pulse 69   Ht 5\' 10"  (1.778 m)   Wt 198 lb (89.8 kg)   SpO2 98%   BMI 28.41 kg/m   Wt Readings from Last 3 Encounters:  12/18/23 198 lb (89.8 kg)  09/14/23 195 lb (88.5 kg)  06/09/23 194 lb (88 kg)    Physical Exam Vitals and nursing note reviewed.  Constitutional:      General: He is not in acute distress.    Appearance: He is well-developed. He is not diaphoretic.  Eyes:     General: No  scleral icterus.    Conjunctiva/sclera: Conjunctivae normal.  Neck:     Thyroid: No thyromegaly.  Cardiovascular:     Rate and Rhythm: Normal rate and regular rhythm.     Heart sounds: Normal heart sounds. No murmur heard. Pulmonary:     Effort: Pulmonary effort is normal. No respiratory distress.     Breath sounds: Normal breath sounds. No wheezing.  Musculoskeletal:        General: No swelling. Normal range of motion.     Cervical back:  Neck supple.  Lymphadenopathy:     Cervical: No cervical adenopathy.  Skin:    General: Skin is warm and dry.     Findings: No rash.  Neurological:     Mental Status: He is alert and oriented to person, place, and time.     Coordination: Coordination normal.  Psychiatric:        Behavior: Behavior normal.       Assessment & Plan:   Problem List Items Addressed This Visit       Cardiovascular and Mediastinum   Hypertension associated with diabetes (HCC)   Relevant Medications   sildenafil (VIAGRA) 100 MG tablet   Other Relevant Orders   CBC with Differential/Platelet   CMP14+EGFR   Lipid panel   Bayer DCA Hb A1c Waived   Lead, blood     Endocrine   Type 2 diabetes mellitus without complications (HCC) - Primary   Relevant Orders   CBC with Differential/Platelet   CMP14+EGFR   Lipid panel   Bayer DCA Hb A1c Waived   Lead, blood   Hyperlipidemia associated with type 2 diabetes mellitus (HCC)   Relevant Medications   sildenafil (VIAGRA) 100 MG tablet   Other Relevant Orders   CBC with Differential/Platelet   CMP14+EGFR   Lipid panel   Bayer DCA Hb A1c Waived   Lead, blood     Other   Depression, major, single episode, mild (HCC)   Other Visit Diagnoses       Erectile dysfunction, unspecified erectile dysfunction type       Relevant Medications   sildenafil (VIAGRA) 100 MG tablet       Will give him prescription for generic Viagra that he can use as needed.  Check blood work today. A1c was slightly up at 7.1, he is  going to focus on diet and getting it back down.  Also in the spring and summer he is more active so he knows that helps as well. Blood pressure slightly up as well and he will keep a close eye on that.  No change in medication but mainly focus on diet Follow up plan: Return in about 3 months (around 03/16/2024), or if symptoms worsen or fail to improve, for Diabetes hypertension and cholesterol.  Counseling provided for all of the vaccine components Orders Placed This Encounter  Procedures   CBC with Differential/Platelet   CMP14+EGFR   Lipid panel   Bayer DCA Hb A1c Waived   Lead, blood    Arville Care, MD [facility] 12/18/2023, 10:51 AM

## 2023-12-20 LAB — CMP14+EGFR
ALT: 12 IU/L (ref 0–44)
AST: 12 IU/L (ref 0–40)
Albumin: 4.3 g/dL (ref 3.9–4.9)
Alkaline Phosphatase: 89 IU/L (ref 44–121)
BUN/Creatinine Ratio: 10 (ref 10–24)
BUN: 11 mg/dL (ref 8–27)
Bilirubin Total: 0.2 mg/dL (ref 0.0–1.2)
CO2: 25 mmol/L (ref 20–29)
Calcium: 9.8 mg/dL (ref 8.6–10.2)
Chloride: 102 mmol/L (ref 96–106)
Creatinine, Ser: 1.14 mg/dL (ref 0.76–1.27)
Globulin, Total: 1.9 g/dL (ref 1.5–4.5)
Glucose: 89 mg/dL (ref 70–99)
Potassium: 4.7 mmol/L (ref 3.5–5.2)
Sodium: 140 mmol/L (ref 134–144)
Total Protein: 6.2 g/dL (ref 6.0–8.5)
eGFR: 71 mL/min/1.73

## 2023-12-20 LAB — LIPID PANEL
Chol/HDL Ratio: 2.6 ratio (ref 0.0–5.0)
Cholesterol, Total: 121 mg/dL (ref 100–199)
HDL: 46 mg/dL
LDL Chol Calc (NIH): 59 mg/dL (ref 0–99)
Triglycerides: 80 mg/dL (ref 0–149)
VLDL Cholesterol Cal: 16 mg/dL (ref 5–40)

## 2023-12-20 LAB — CBC WITH DIFFERENTIAL/PLATELET
Basophils Absolute: 0.1 x10E3/uL (ref 0.0–0.2)
Basos: 1 %
EOS (ABSOLUTE): 0.3 x10E3/uL (ref 0.0–0.4)
Eos: 4 %
Hematocrit: 43.4 % (ref 37.5–51.0)
Hemoglobin: 14.5 g/dL (ref 13.0–17.7)
Immature Grans (Abs): 0 x10E3/uL (ref 0.0–0.1)
Immature Granulocytes: 0 %
Lymphocytes Absolute: 2.7 x10E3/uL (ref 0.7–3.1)
Lymphs: 38 %
MCH: 30.3 pg (ref 26.6–33.0)
MCHC: 33.4 g/dL (ref 31.5–35.7)
MCV: 91 fL (ref 79–97)
Monocytes Absolute: 0.6 x10E3/uL (ref 0.1–0.9)
Monocytes: 9 %
Neutrophils Absolute: 3.4 x10E3/uL (ref 1.4–7.0)
Neutrophils: 48 %
Platelets: 242 x10E3/uL (ref 150–450)
RBC: 4.78 x10E6/uL (ref 4.14–5.80)
RDW: 13.9 % (ref 11.6–15.4)
WBC: 7 x10E3/uL (ref 3.4–10.8)

## 2023-12-20 LAB — LEAD, BLOOD (ADULT >= 16 YRS): Lead-Whole Blood: 3.7 ug/dL — ABNORMAL HIGH (ref 0.0–3.4)

## 2023-12-21 ENCOUNTER — Encounter: Payer: Self-pay | Admitting: Family Medicine

## 2023-12-29 ENCOUNTER — Ambulatory Visit: Payer: Medicare HMO

## 2023-12-29 VITALS — Ht 70.0 in | Wt 198.0 lb

## 2023-12-29 DIAGNOSIS — Z0000 Encounter for general adult medical examination without abnormal findings: Secondary | ICD-10-CM

## 2023-12-29 NOTE — Progress Notes (Signed)
 Subjective:   Sean Price is a 68 y.o. who presents for a Medicare Wellness preventive visit.  Visit Complete: Virtual I connected with  Sean Price on 12/29/23 by a audio enabled telemedicine application and verified that I am speaking with the correct person using two identifiers.  Patient Location: Home  Provider Location: Home Office  I discussed the limitations of evaluation and management by telemedicine. The patient expressed understanding and agreed to proceed.  Vital Signs: Because this visit was a virtual/telehealth visit, some criteria may be missing or patient reported. Any vitals not documented were not able to be obtained and vitals that have been documented are patient reported.  VideoDeclined- This patient declined Librarian, academic. Therefore the visit was completed with audio only.  AWV Questionnaire: No: Patient Medicare AWV questionnaire was not completed prior to this visit.  Cardiac Risk Factors include: advanced age (>36men, >56 women);diabetes mellitus;hypertension;male gender;dyslipidemia     Objective:    Today's Vitals   12/29/23 1202  Weight: 198 lb (89.8 kg)  Height: 5\' 10"  (1.778 m)   Body mass index is 28.41 kg/m.     12/29/2023   12:15 PM 12/12/2022    9:43 AM 11/01/2016    7:56 AM  Advanced Directives  Does Patient Have a Medical Advance Directive? No Yes Yes  Type of Best boy of [place];Living will  Does patient want to make changes to medical advance directive?  Yes (Inpatient - patient defers changing a medical advance directive at this time - Information given)   Would patient like information on creating a medical advance directive? Yes (MAU/Ambulatory/Procedural Areas - Information given) No - Guardian declined     Current Medications (verified) Outpatient Encounter Medications as of 12/29/2023  Medication Sig   ARIPiprazole (ABILIFY) 5 MG tablet Take 1 tablet (5  mg total) by mouth daily.   buPROPion (WELLBUTRIN XL) 300 MG 24 hr tablet Take 1 tablet (300 mg total) by mouth daily. TAKE 1 TABLET BY MOUTH ONCE DAILY   glipiZIDE (GLUCOTROL) 5 MG tablet Take 1 tablet (5 mg total) by mouth 2 (two) times daily before a meal.   glucose 5 g chewable tablet Chew 15 g by mouth as needed for low blood sugar.   Glucose Blood (BLOOD GLUCOSE TEST STRIPS) STRP 1 each by In Vitro route 2 (two) times daily.   HYDROcodone-acetaminophen (NORCO/VICODIN) 5-325 MG tablet Take 1 tablet by mouth every 6 (six) hours as needed.   Lancets (ONETOUCH ULTRASOFT) lancets Use as instructed   lisinopril-hydrochlorothiazide (ZESTORETIC) 20-25 MG tablet Take 0.5 tablets by mouth daily.   metFORMIN (GLUCOPHAGE) 500 MG tablet Take 1 tablet (500 mg total) by mouth 2 (two) times daily with a meal.   pantoprazole (PROTONIX) 40 MG tablet Take 1 tablet (40 mg total) by mouth daily.   PARoxetine (PAXIL) 20 MG tablet Take 1 tablet (20 mg total) by mouth daily.   pramipexole (MIRAPEX) 0.5 MG tablet Take 1 tablet (0.5 mg total) by mouth 3 (three) times daily.   rosuvastatin (CRESTOR) 10 MG tablet Take 1 tablet (10 mg total) by mouth daily.   sildenafil (VIAGRA) 100 MG tablet Take 1 tablet (100 mg total) by mouth daily as needed for erectile dysfunction.   tiZANidine (ZANAFLEX) 4 MG tablet    No facility-administered encounter medications on file as of 12/29/2023.    Allergies (verified) Patient has no known allergies.   History: Past Medical History:  Diagnosis Date  ADD (attention deficit disorder)    Anxiety    Atrial fibrillation with RVR (HCC)    new found on 12/18/10 visit   Bipolar disorder (HCC) 01/2014   BPH with elevated PSA    Depression    Diabetes mellitus without complication (HCC)    GERD (gastroesophageal reflux disease)    Hyperlipidemia    Hypertension    Myocardial infarction [facility])    Sinus arrhythmia    Past Surgical History:  Procedure Laterality Date   bilateral  inguinal hernia     CARPAL TUNNEL RELEASE  1999   left hand   CERVICAL FUSION  2007   c5-c6   COLONOSCOPY     compound fracture  1999, 2001, 2001   right leg, insertion of hardware in 2001, removal of hardware 2001   FRACTURE SURGERY     HERNIA REPAIR Bilateral 2012   LEG SURGERY Right    hardware removal   PROSTATE BIOPSY     TRUS/BX  BPH only   TONSILLECTOMY  1970   Family History  Problem Relation Age of Onset   Diabetes Mother    Heart attack Father 76       massive /died   Other Father        blood infection   Breast cancer Sister        mid 40's   Colon cancer Neg Hx    Colon polyps Neg Hx    Esophageal cancer Neg Hx    Rectal cancer Neg Hx    Stomach cancer Neg Hx    Social History   Socioeconomic History   Marital status: Married    Spouse name: Bjorn Loser   Number of children: 3   Years of education: Not on file   Highest education level: Associate degree: occupational, Scientist, product/process development, or vocational program  Occupational History   Occupation: SCHEDULING/planner    Employer: [employer]  Tobacco Use   Smoking status: Former    Current packs/day: 0.00    Types: Cigarettes    Start date: 05/29/1980    Quit date: 05/29/1984    Years since quitting: 39.6   Smokeless tobacco: Never  Vaping Use   Vaping status: Never Used  Substance and Sexual Activity   Alcohol use: Not Currently   Drug use: No   Sexual activity: Yes  Other Topics Concern   Not on file  Social History Narrative   Not on file   Social Drivers of Health   Financial Resource Strain: Low Risk  (12/29/2023)   Overall Financial Resource Strain (CARDIA)    Difficulty of Paying Living Expenses: Not hard at all  Food Insecurity: No Food Insecurity (12/29/2023)   Hunger Vital Sign    Worried About Running Out of Food in the Last Year: Never true    Ran Out of Food in the Last Year: Never true  Transportation Needs: No Transportation Needs (12/29/2023)   PRAPARE - Doctor, general practice (Medical): No    Lack of Transportation (Non-Medical): No  Physical Activity: Insufficiently Active (12/29/2023)   Exercise Vital Sign    Days of Exercise per Week: 7 days    Minutes of Exercise per Session: 20 min  Stress: Stress Concern Present (12/29/2023)   [employer] of Occupational Health - Occupational Stress Questionnaire    Feeling of Stress : Rather much  Social Connections: Socially Isolated (12/29/2023)   Social Connection and Isolation Panel [NHANES]    Frequency of Communication with Friends  and Family: Once a week    Frequency of Social Gatherings with Friends and Family: Once a week    Attends Religious Services: Never    Database administrator or Organizations: No    Attends Engineer, structural: Never    Marital Status: Married    Tobacco Counseling Counseling given: Not Answered    Clinical Intake:  Pre-visit preparation completed: Yes  Pain : No/denies pain     Diabetes: Yes CBG done?: No Did pt. bring in CBG monitor from home?: No  How often do you need to have someone help you when you read instructions, pamphlets, or other written materials from your doctor or pharmacy?: 1 - Never  Interpreter Needed?: No  Information entered by :: Kandis Fantasia LPN   Activities of Daily Living     12/28/2023   11:43 AM  In your present state of health, do you have any difficulty performing the following activities:  Hearing? 0  Vision? 0  Difficulty concentrating or making decisions? 0  Walking or climbing stairs? 0  Dressing or bathing? 0  Doing errands, shopping? 0  Preparing Food and eating ? N  Using the Toilet? N  In the past six months, have you accidently leaked urine? N  Do you have problems with loss of bowel control? N  Managing your Medications? N  Managing your Finances? N  Housekeeping or managing your Housekeeping? N    Patient Care Team: Dettinger, Elige Radon, MD as PCP - General (Family  Medicine)  Indicate any recent Medical Services you may have received from other than [facility] providers in the past year (date may be approximate).     Assessment:   This is a routine wellness examination for Sean Price.  Hearing/Vision screen Hearing Screening - Comments:: Denies hearing difficulties   Vision Screening - Comments:: up to date with routine eye exams with in office diabetic eye exam      Goals Addressed             This Visit's Progress    Remain active and independent         Depression Screen     12/29/2023   12:19 PM 09/14/2023    9:55 AM 06/09/2023   10:03 AM 04/14/2023   11:12 AM 01/12/2023    8:24 AM 12/12/2022    9:30 AM 10/12/2022    7:58 AM  PHQ 2/9 Scores  PHQ - 2 Score 2 2 0 2 3 3 3   PHQ- 9 Score 7 7 8 13 16 9 9     Fall Risk     12/29/2023   12:18 PM 12/28/2023   11:43 AM 09/14/2023    9:55 AM 06/09/2023   10:03 AM 04/14/2023   11:12 AM  Fall Risk   Falls in the past year? 0 0 0 0 1  Number falls in past yr: 0    0  Injury with Fall? 0    1  Risk for fall due to : No Fall Risks    Impaired balance/gait  Follow up Falls prevention discussed;Education provided;Falls evaluation completed    Falls evaluation completed    MEDICARE RISK AT HOME:  Medicare Risk at Home Any stairs in or around the home?: (Patient-Rptd) Yes If so, are there any without handrails?: (Patient-Rptd) Yes Home free of loose throw rugs in walkways, pet beds, electrical cords, etc?: (Patient-Rptd) No Adequate lighting in your home to reduce risk of falls?: (Patient-Rptd) Yes Life alert?: (Patient-Rptd) No Use  of a cane, walker or w/c?: (Patient-Rptd) No Grab bars in the bathroom?: (Patient-Rptd) No Shower chair or bench in shower?: (Patient-Rptd) No Elevated toilet seat or a handicapped toilet?: (Patient-Rptd) No  TIMED UP AND GO:  Was the test performed?  No  Cognitive Function: 6CIT completed        12/29/2023   12:18 PM 12/12/2022    9:44 AM  6CIT Screen  What  Year? 0 points 0 points  What month? 0 points 0 points  What time? 0 points 0 points  Count back from 20 0 points 0 points  Months in reverse 0 points 0 points  Repeat phrase 0 points 2 points  Total Score 0 points 2 points    Immunizations Immunization History  Administered Date(s) Administered   Tdap 12/12/2022    Screening Tests Health Maintenance  Topic Date Due   COVID-19 Vaccine (1 - 2024-25 season) 01/03/2024 (Originally 07/02/2023)   Colonoscopy  01/12/2024 (Originally 06/12/2014)   Zoster Vaccines- Shingrix (1 of 2) 01/12/2024 (Originally 02/27/2007)   INFLUENZA VACCINE  01/29/2024 (Originally 06/01/2023)   Pneumonia Vaccine 62+ Years old (1 of 2 - PCV) 04/13/2024 (Originally 02/27/1962)   HEMOGLOBIN A1C  06/16/2024   Diabetic kidney evaluation - Urine ACR  09/13/2024   FOOT EXAM  09/13/2024   OPHTHALMOLOGY EXAM  09/19/2024   Diabetic kidney evaluation - eGFR measurement  12/17/2024   Medicare Annual Wellness (AWV)  12/28/2024   DTaP/Tdap/Td (2 - Td or Tdap) 12/12/2032   Hepatitis C Screening  Completed   HPV VACCINES  Aged Out    Health Maintenance  There are no preventive care reminders to display for this patient.  Additional Screening:  Vision Screening: Recommended annual ophthalmology exams for early detection of glaucoma and other disorders of the eye.  Dental Screening: Recommended annual dental exams for proper oral hygiene  Community Resource Referral / Chronic Care Management: CRR required this visit?  No   CCM required this visit?  No     Plan:     I have personally reviewed and noted the following in the patient's chart:   Medical and social history Use of alcohol, tobacco or illicit drugs  Current medications and supplements including opioid prescriptions. Patient is currently taking opioid prescriptions. Information provided to patient regarding non-opioid alternatives. Patient advised to discuss non-opioid treatment plan with their  provider. Functional ability and status Nutritional status Physical activity Advanced directives List of other physicians Hospitalizations, surgeries, and ER visits in previous 12 months Vitals Screenings to include cognitive, depression, and falls Referrals and appointments  In addition, I have reviewed and discussed with patient certain preventive protocols, quality metrics, and best practice recommendations. A written personalized care plan for preventive services as well as general preventive health recommendations were provided to patient.     Kandis Fantasia [place], [place]   1/61/0960   After Visit Summary: (MyChart) Due to this being a telephonic visit, the after visit summary with patients personalized plan was offered to patient via MyChart   Notes: Please refer to Routing Comments.

## 2023-12-29 NOTE — Patient Instructions (Signed)
 Mr. Sean Price , Thank you for taking time to come for your Medicare Wellness Visit. I appreciate your ongoing commitment to your health goals. Please review the following plan we discussed and let me know if I can assist you in the future.   Referrals/Orders/Follow-Ups/Clinician Recommendations: Aim for 30 minutes of exercise or brisk walking, 6-8 glasses of water, and 5 servings of fruits and vegetables each day.  This is a list of the screening recommended for you and due dates:  Health Maintenance  Topic Date Due   COVID-19 Vaccine (1 - 2024-25 season) 01/03/2024*   Colon Cancer Screening  01/12/2024*   Zoster (Shingles) Vaccine (1 of 2) 01/12/2024*   Flu Shot  01/29/2024*   Pneumonia Vaccine (1 of 2 - PCV) 04/13/2024*   Hemoglobin A1C  06/16/2024   Yearly kidney health urinalysis for diabetes  09/13/2024   Complete foot exam   09/13/2024   Eye exam for diabetics  09/19/2024   Yearly kidney function blood test for diabetes  12/17/2024   Medicare Annual Wellness Visit  12/28/2024   DTaP/Tdap/Td vaccine (2 - Td or Tdap) 12/12/2032   Hepatitis C Screening  Completed   HPV Vaccine  Aged Out  *Topic was postponed. The date shown is not the original due date.    Advanced directives: (ACP Link)Information on Advanced Care Planning can be found at [facility] Advance Health Care Directives Advance Health Care Directives ([email or web address]   Next Medicare Annual Wellness Visit scheduled for next year: Yes

## 2024-01-01 MED ORDER — LISINOPRIL-HYDROCHLOROTHIAZIDE 10-12.5 MG PO TABS: 1.0000 | ORAL_TABLET | Freq: Every day | ORAL | 3 refills | Status: AC

## 2024-01-01 NOTE — Telephone Encounter (Signed)
Pt made aware and has no further concerns.

## 2024-01-01 NOTE — Addendum Note (Signed)
 Addended by: Arville Care on: 01/01/2024 07:45 AM   Modules accepted: Orders

## 2024-01-01 NOTE — Telephone Encounter (Signed)
-----   Message from Elige Radon Dettinger sent at 01/01/2024  7:45 AM EST ----- Please let him know that I have sent a lower dose of the lisinopril hydrochlorothiazide so he does not have to cut it in half.  Yes continue the supplements to help with blood levels for now. ----- Message ----- From: Anthoney Harada, LPN Sent: 12/14/863   1:39 PM EST To: Elige Radon Dettinger, MD  Patient asking if he should continue supplements to help with lead levels. Also having a problem with cutting blood pressure medication in half asking if there is an alternative (Lisinopril/hydrochlorothiazide)

## 2024-03-11 ENCOUNTER — Encounter (HOSPITAL_COMMUNITY): Payer: Self-pay

## 2024-03-15 IMAGING — DX DG FOOT COMPLETE 3+V*L*
3 series · 3 of 3 positions shown · non-contrast
Comparison: None Available.

CLINICAL DATA: Left foot pain.

EXAM:
LEFT FOOT - COMPLETE 3+ VIEW

[foot ap]
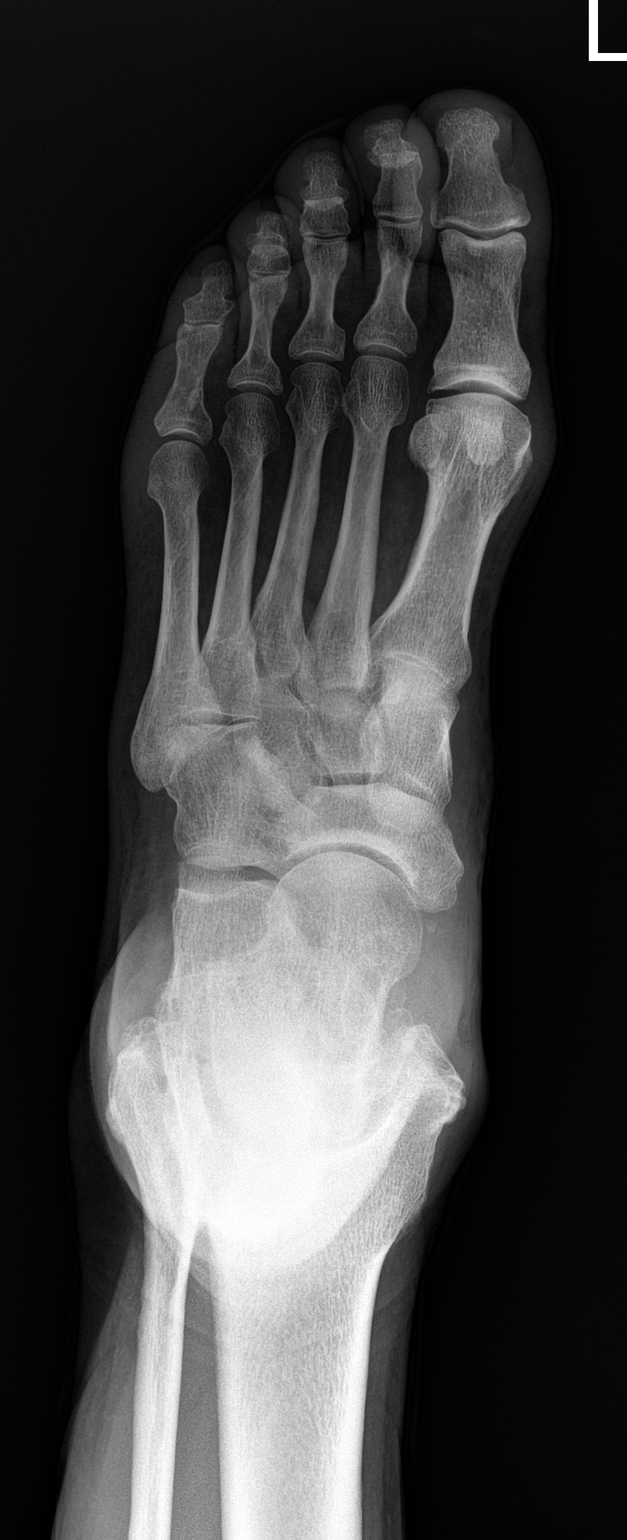

[foot lat]
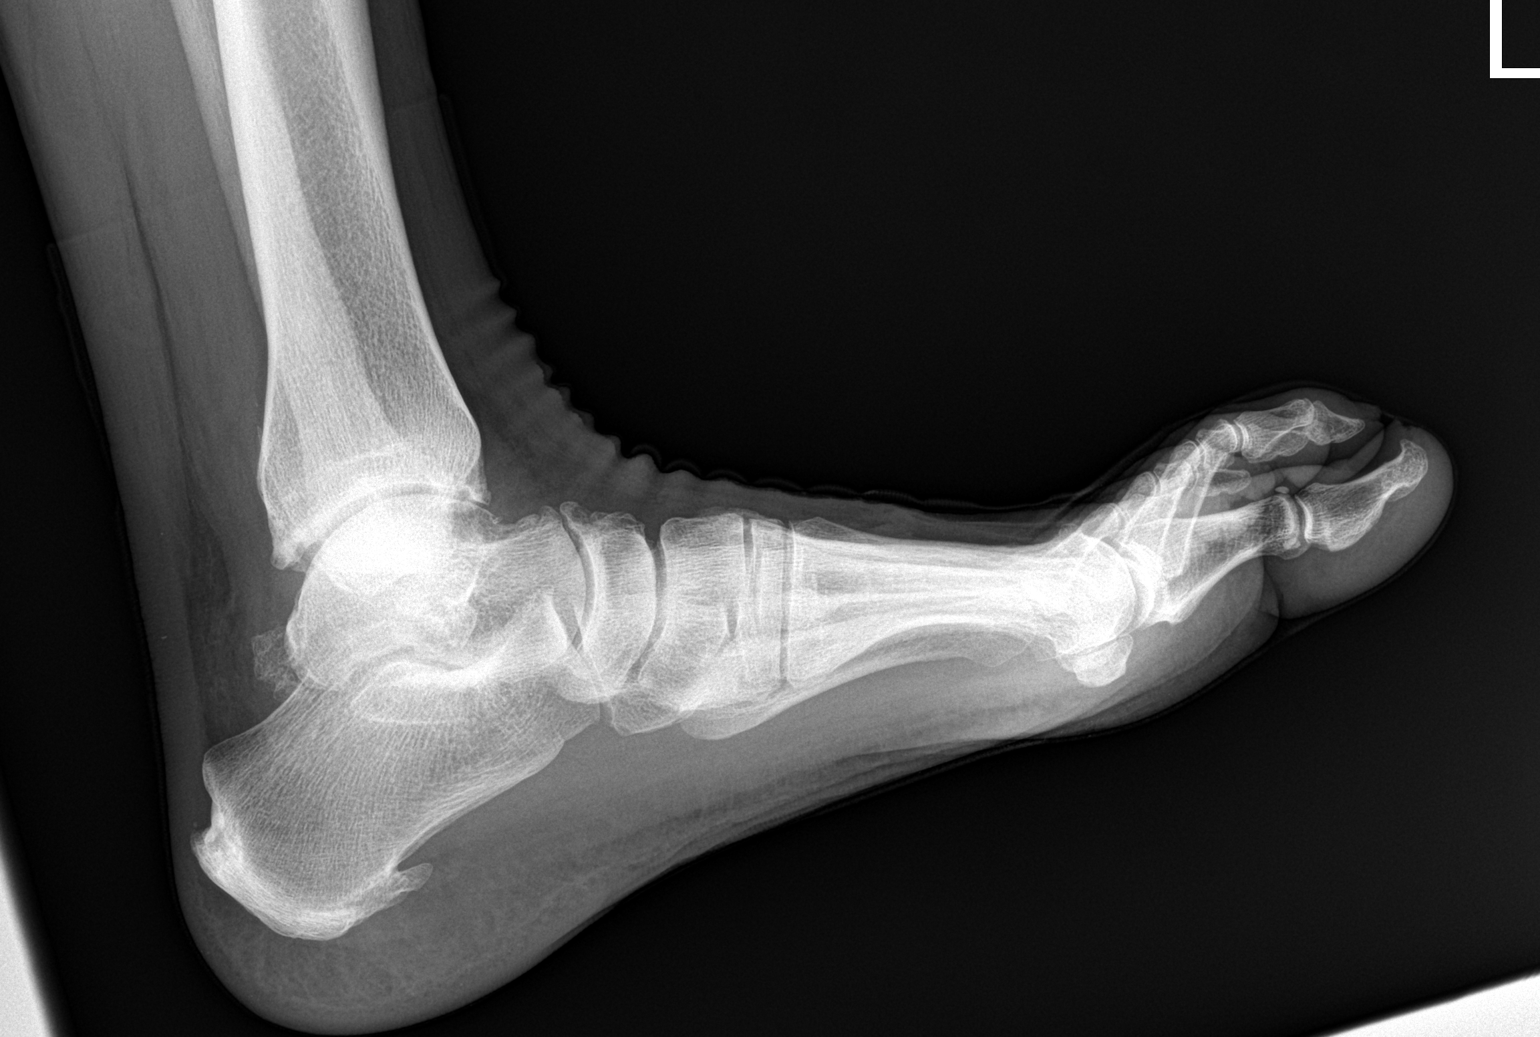

[foot obl]
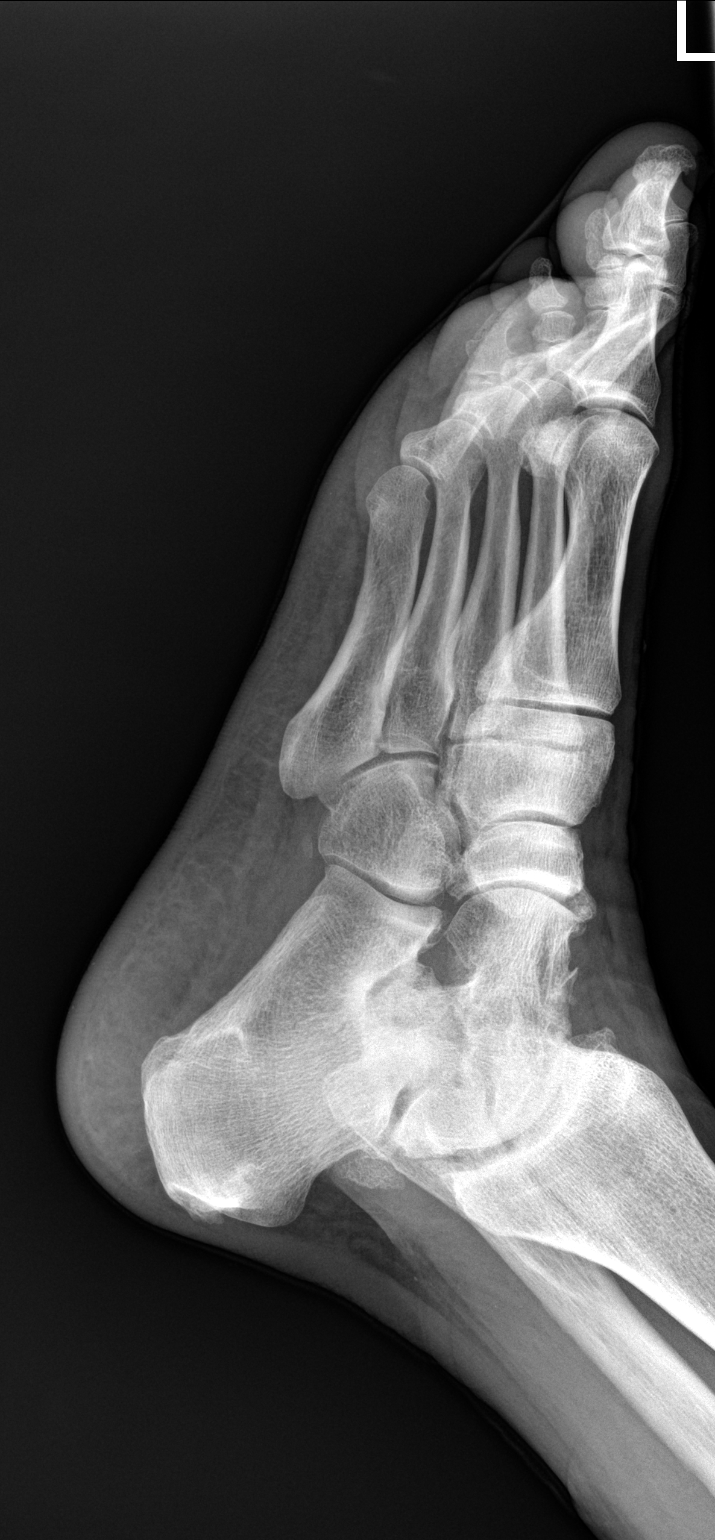

[3 of 3 positions shown; findings below may reference images not displayed]

FINDINGS: There is no evidence of fracture or dislocation. Degenerative joint
changes with osteophyte formation are noted in the mid and hindfoot.
Plantar calcaneal spur is noted. Soft tissues are unremarkable.
IMPRESSION: Degenerative joint changes of the left foot. Plantar calcaneal spur
noted.

## 2024-03-18 ENCOUNTER — Ambulatory Visit: Payer: Medicare HMO | Admitting: Family Medicine

## 2024-03-18 ENCOUNTER — Encounter: Payer: Self-pay | Admitting: Family Medicine

## 2024-03-18 VITALS — BP 126/83 | HR 78 | Ht 70.0 in | Wt 185.0 lb

## 2024-03-18 DIAGNOSIS — F320 Major depressive disorder, single episode, mild: Secondary | ICD-10-CM

## 2024-03-18 DIAGNOSIS — I152 Hypertension secondary to endocrine disorders: Secondary | ICD-10-CM | POA: Diagnosis not present

## 2024-03-18 DIAGNOSIS — G2581 Restless legs syndrome: Secondary | ICD-10-CM | POA: Insufficient documentation

## 2024-03-18 DIAGNOSIS — E1169 Type 2 diabetes mellitus with other specified complication: Secondary | ICD-10-CM

## 2024-03-18 DIAGNOSIS — E119 Type 2 diabetes mellitus without complications: Secondary | ICD-10-CM

## 2024-03-18 DIAGNOSIS — E785 Hyperlipidemia, unspecified: Secondary | ICD-10-CM

## 2024-03-18 DIAGNOSIS — Z7984 Long term (current) use of oral hypoglycemic drugs: Secondary | ICD-10-CM | POA: Diagnosis not present

## 2024-03-18 DIAGNOSIS — E1159 Type 2 diabetes mellitus with other circulatory complications: Secondary | ICD-10-CM

## 2024-03-18 LAB — BAYER DCA HB A1C WAIVED: HB A1C (BAYER DCA - WAIVED): 6.3 % — ABNORMAL HIGH (ref 4.8–5.6)

## 2024-03-18 MED ORDER — ONETOUCH DELICA LANCETS 33G MISC: 3 refills | Status: AC

## 2024-03-18 MED ORDER — METFORMIN HCL 500 MG PO TABS: 500.0000 mg | ORAL_TABLET | Freq: Two times a day (BID) | ORAL | 3 refills | Status: AC

## 2024-03-18 MED ORDER — GLIPIZIDE 5 MG PO TABS: 5.0000 mg | ORAL_TABLET | Freq: Two times a day (BID) | ORAL | 3 refills | Status: AC

## 2024-03-18 MED ORDER — BLOOD GLUCOSE TEST VI STRP: 1.0000 | ORAL_STRIP | Freq: Two times a day (BID) | 3 refills | Status: AC

## 2024-03-18 MED ORDER — ONETOUCH ULTRASOFT LANCETS MISC: 12 refills | Status: DC

## 2024-03-18 MED ORDER — PRAMIPEXOLE DIHYDROCHLORIDE 0.5 MG PO TABS
0.5000 mg | ORAL_TABLET | Freq: Three times a day (TID) | ORAL | 3 refills | Status: DC
Start: 2024-03-18 — End: 2024-06-20

## 2024-03-18 MED ORDER — PAROXETINE HCL 20 MG PO TABS: 20.0000 mg | ORAL_TABLET | Freq: Every day | ORAL | 3 refills | Status: AC

## 2024-03-18 NOTE — Patient Instructions (Signed)
 Please call  Gastroenterology to schedule your Colonoscopy. [phone].

## 2024-03-18 NOTE — Addendum Note (Signed)
 Addended by: Zahari Xiang D on: 03/18/2024 02:41 PM   Modules accepted: Orders

## 2024-03-18 NOTE — Progress Notes (Signed)
 BP 126/83   Pulse 78   Ht 5\' 10"  (1.778 m)   Wt 185 lb (83.9 kg)   SpO2 100%   BMI 26.54 kg/m    Subjective:   Patient ID: Sean Price, male    DOB: [birth year], 68 y.o.   MRN: [id number]  HPI: Sean Price is a 68 y.o. male presenting on 03/18/2024 for Medical Management of Chronic Issues and Diabetes   HPI Type 2 diabetes mellitus Patient comes in today for recheck of his diabetes. Patient has been currently taking glipizide  and metformin . Patient is currently on an ACE inhibitor/ARB. Patient has not seen an ophthalmologist this year. Patient denies any new issues with their feet. The symptom started onset as an adult hypertension and hyperlipidemia and depression ARE RELATED TO DM   Hypertension Patient is currently on lisinopril -hydrochlorothiazide , and their blood pressure today is 126/83. Patient denies any lightheadedness or dizziness. Patient denies headaches, blurred vision, chest pains, shortness of breath, or weakness. Denies any side effects from medication and is content with current medication.   Hyperlipidemia Patient is coming in for recheck of his hyperlipidemia. The patient is currently taking rosuvastatin . They deny any issues with myalgias or history of liver damage from it. They deny any focal numbness or weakness or chest pain.   Depression recheck Patient is coming in for depression recheck.  Currently takes Wellbutrin  and Abilify  and Paxil .  Patient has been off Abilify  for a couple weeks because he had got a refill from the pharmacy and just realized that.  He does have it now but has not restarted it and felt like he was doing better although he admits that he has been more anxious and is not sleeping as well.    03/18/2024    8:02 AM 12/29/2023   12:19 PM 09/14/2023    9:55 AM 06/09/2023   10:03 AM 04/14/2023   11:12 AM  Depression screen PHQ 2/9  Decreased Interest 1 1 1  0 0  Down, Depressed, Hopeless 2 1 1  0 2  PHQ - 2 Score 3 2 2  0 2   Altered sleeping 3 3 3 3  0  Tired, decreased energy 1 0 0 0 0  Change in appetite 0 0 0 0 0  Feeling bad or failure about yourself  0 0 0 0 3  Trouble concentrating 2 2 2 2 3   Moving slowly or fidgety/restless 3 0 0 3 3  Suicidal thoughts 0 0 0 0 2  PHQ-9 Score 12 7 7 8 13   Difficult doing work/chores Somewhat difficult  Not difficult at all Somewhat difficult Somewhat difficult     Relevant past medical, surgical, family and social history reviewed and updated as indicated. Interim medical history since our last visit reviewed. Allergies and medications reviewed and updated.  Review of Systems  Constitutional:  Negative for chills and fever.  Eyes:  Negative for visual disturbance.  Respiratory:  Negative for shortness of breath and wheezing.   Cardiovascular:  Negative for chest pain and leg swelling.  Musculoskeletal:  Negative for back pain and gait problem.  Skin:  Negative for rash.  Neurological:  Negative for dizziness and light-headedness.  Psychiatric/Behavioral:  Positive for decreased concentration, dysphoric mood and sleep disturbance. Negative for self-injury and suicidal ideas. The patient is nervous/anxious.   All other systems reviewed and are negative.   Per HPI unless specifically indicated above   Allergies as of 03/18/2024   No Known Allergies      Medication  List        Accurate as of Mar 18, 2024  8:25 AM. If you have any questions, ask your nurse or doctor.          ARIPiprazole  5 MG tablet Commonly known as: ABILIFY  Take 1 tablet (5 mg total) by mouth daily.   BLOOD GLUCOSE TEST STRIPS Strp 1 each by In Vitro route 2 (two) times daily.   buPROPion  300 MG 24 hr tablet Commonly known as: WELLBUTRIN  XL Take 1 tablet (300 mg total) by mouth daily. TAKE 1 TABLET BY MOUTH ONCE DAILY   glipiZIDE  5 MG tablet Commonly known as: GLUCOTROL  Take 1 tablet (5 mg total) by mouth 2 (two) times daily before a meal.   glucose 5 g chewable  tablet Chew 15 g by mouth as needed for low blood sugar.   HYDROcodone -acetaminophen  5-325 MG tablet Commonly known as: NORCO/VICODIN Take 1 tablet by mouth every 6 (six) hours as needed.   lisinopril -hydrochlorothiazide  10-12.5 MG tablet Commonly known as: ZESTORETIC  Take 1 tablet by mouth daily.   metFORMIN  500 MG tablet Commonly known as: GLUCOPHAGE  Take 1 tablet (500 mg total) by mouth 2 (two) times daily with a meal.   onetouch ultrasoft lancets Use as instructed   pantoprazole  40 MG tablet Commonly known as: PROTONIX  Take 1 tablet (40 mg total) by mouth daily.   PARoxetine  20 MG tablet Commonly known as: PAXIL  Take 1 tablet (20 mg total) by mouth daily.   pramipexole  0.5 MG tablet Commonly known as: MIRAPEX  Take 1 tablet (0.5 mg total) by mouth 3 (three) times daily.   rosuvastatin  10 MG tablet Commonly known as: CRESTOR  Take 1 tablet (10 mg total) by mouth daily.   sildenafil  100 MG tablet Commonly known as: Viagra  Take 1 tablet (100 mg total) by mouth daily as needed for erectile dysfunction.   tiZANidine 4 MG tablet Commonly known as: ZANAFLEX         Objective:   BP 126/83   Pulse 78   Ht 5\' 10"  (1.778 m)   Wt 185 lb (83.9 kg)   SpO2 100%   BMI 26.54 kg/m   Wt Readings from Last 3 Encounters:  03/18/24 185 lb (83.9 kg)  12/29/23 198 lb (89.8 kg)  12/18/23 198 lb (89.8 kg)    Physical Exam Vitals and nursing note reviewed.  Constitutional:      General: He is not in acute distress.    Appearance: He is well-developed. He is not diaphoretic.  Eyes:     General: No scleral icterus.    Conjunctiva/sclera: Conjunctivae normal.  Neck:     Thyroid: No thyromegaly.  Cardiovascular:     Rate and Rhythm: Normal rate and regular rhythm.     Heart sounds: Normal heart sounds. No murmur heard. Pulmonary:     Effort: Pulmonary effort is normal. No respiratory distress.     Breath sounds: Normal breath sounds. No wheezing.  Musculoskeletal:         General: No swelling. Normal range of motion.     Cervical back: Neck supple.  Lymphadenopathy:     Cervical: No cervical adenopathy.  Skin:    General: Skin is warm and dry.     Findings: No rash.  Neurological:     Mental Status: He is alert and oriented to person, place, and time.     Coordination: Coordination normal.  Psychiatric:        Behavior: Behavior normal.       Assessment &  Plan:   Problem List Items Addressed This Visit       Cardiovascular and Mediastinum   Hypertension associated with diabetes (HCC)   Relevant Medications   glipiZIDE  (GLUCOTROL ) 5 MG tablet   metFORMIN  (GLUCOPHAGE ) 500 MG tablet     Endocrine   Type 2 diabetes mellitus without complications (HCC) - Primary   Relevant Medications   glipiZIDE  (GLUCOTROL ) 5 MG tablet   Glucose Blood (BLOOD GLUCOSE TEST STRIPS) STRP   Lancets (ONETOUCH ULTRASOFT) lancets   metFORMIN  (GLUCOPHAGE ) 500 MG tablet   Other Relevant Orders   Bayer DCA Hb A1c Waived   Hyperlipidemia associated with type 2 diabetes mellitus (HCC)   Relevant Medications   glipiZIDE  (GLUCOTROL ) 5 MG tablet   metFORMIN  (GLUCOPHAGE ) 500 MG tablet     Other   Depression, major, single episode, mild (HCC)   Relevant Medications   PARoxetine  (PAXIL ) 20 MG tablet   Other Relevant Orders   Ambulatory referral to Neurology   RLS (restless legs syndrome)   Relevant Medications   pramipexole  (MIRAPEX ) 0.5 MG tablet   Other Relevant Orders   Ambulatory referral to Neurology    Will get blood work on the way out today.  Continue current medicine, will do referral to neurology for sleep issues Recommended that he retry the Abilify  again because I think it was helping him more than he thinks.  He will try it for 2 weeks and let me know how it goes. Follow up plan: Return in about 3 months (around 06/18/2024), or if symptoms worsen or fail to improve, for Diabetes recheck.  Counseling provided for all of the vaccine components Orders  Placed This Encounter  Procedures   Bayer DCA Hb A1c Waived   Ambulatory referral to Neurology    Jolyne Needs, MD [facility] 03/18/2024, 8:25 AM

## 2024-03-18 NOTE — Progress Notes (Signed)
 Fax from [employer] uses [employer] instead of one Training and development officer script w/ directions

## 2024-05-13 ENCOUNTER — Encounter: Payer: Self-pay | Admitting: Family Medicine

## 2024-05-13 ENCOUNTER — Other Ambulatory Visit: Payer: Self-pay

## 2024-05-13 DIAGNOSIS — F32A Depression, unspecified: Secondary | ICD-10-CM

## 2024-05-13 DIAGNOSIS — F319 Bipolar disorder, unspecified: Secondary | ICD-10-CM

## 2024-05-20 ENCOUNTER — Encounter: Payer: Self-pay | Admitting: Neurology

## 2024-05-20 ENCOUNTER — Ambulatory Visit (INDEPENDENT_AMBULATORY_CARE_PROVIDER_SITE_OTHER): Admitting: Neurology

## 2024-05-20 VITALS — BP 128/78 | HR 79 | Ht 70.0 in | Wt 191.0 lb

## 2024-05-20 DIAGNOSIS — R351 Nocturia: Secondary | ICD-10-CM | POA: Diagnosis not present

## 2024-05-20 DIAGNOSIS — G2581 Restless legs syndrome: Secondary | ICD-10-CM | POA: Diagnosis not present

## 2024-05-20 DIAGNOSIS — Z8679 Personal history of other diseases of the circulatory system: Secondary | ICD-10-CM

## 2024-05-20 DIAGNOSIS — Z9189 Other specified personal risk factors, not elsewhere classified: Secondary | ICD-10-CM

## 2024-05-20 DIAGNOSIS — E663 Overweight: Secondary | ICD-10-CM | POA: Diagnosis not present

## 2024-05-20 DIAGNOSIS — G478 Other sleep disorders: Secondary | ICD-10-CM

## 2024-05-20 NOTE — Progress Notes (Signed)
 Subjective:    Patient ID: Sean Price is a 68 y.o. male.  HPI    True Mar, MD, PhD [facility] [address], [address] [address] [place], [place] [address]  Dear Dr. Maryanne,  I saw your patient, Sean Price, upon your kind request in my sleep clinic today for initial consultation of his sleep disorder, in particular, concern for restless leg syndrome.  The patient is unaccompanied today.  As you know, Sean Price is a 68 year old male with an underlying medical history of reflux disease, hypertension, hyperlipidemia, coronary artery disease with status post MI, degenerative neck disease with status post surgery, status post carpal tunnel surgery, status post tonsillectomy, bipolar disorder, ADD, anxiety, atrial fibrillation and overweight state, who reports a several year history of restless leg symptoms.  Symptoms started typically in the evening.  It does help to walk around.  He stopped taking his pramipexole  about 2 months ago as it was not helping, he was on it for about 2 years.  He has been on Paxil  for about 2 years as well.  He no longer takes hydrocodone , this was for his shoulder surgery.  He drinks quite a bit of caffeine, estimates that he drinks about 2 pots of coffee per day, half and half.  He is retired, lives with his wife and his mother-in-law.  They have 1 dog in the household.  He does fall asleep to the TV and sometimes watches TV when he wakes up early in the morning and then may take a nap.    Bedtime is between 9 and 9:30 and rise time between 3:30 AM and 4.  Since getting an adjustable bed and raising his legs as well as his upper body, he sleeps a little better.  He does have nocturia about 2-3 times per average night.  He is not aware of any snoring or witnessed apneas.  He denies recurrent morning headaches.  His Epworth sleepiness score is 2 out of 24, fatigue severity score is 13 out of 63. I reviewed your office note from  03/18/2024.  Of note, he takes Abilify , Paxil  and Wellbutrin .  He has a prescription for tizanidine.  He was on pramipexole  0.5 mg 3 times daily.  He had blood work in February 2025, I reviewed test results from 12/18/2023 in his electronic chart.  CBC with differential was benign at the time.  He did not have any iron studies at the time.  No recent thyroid function test is on file either.  He had a sleep study several years ago, test results are not available for my review today.  His Past Medical History Is Significant For: Past Medical History:  Diagnosis Date   ADD (attention deficit disorder)    Anxiety    Atrial fibrillation with RVR (HCC)    new found on 12/18/10 visit   Bipolar disorder (HCC) 01/2014   BPH with elevated PSA    Depression    Diabetes mellitus without complication (HCC)    GERD (gastroesophageal reflux disease)    Hyperlipidemia    Hypertension    Myocardial infarction [facility])    Sinus arrhythmia     His Past Surgical History Is Significant For: Past Surgical History:  Procedure Laterality Date   bilateral inguinal hernia     CARPAL TUNNEL RELEASE  1999   left hand   CERVICAL FUSION  2007   c5-c6   COLONOSCOPY     compound fracture  1999, 2001, 2001  right leg, insertion of hardware in 2001, removal of hardware 2001   FRACTURE SURGERY     HERNIA REPAIR Bilateral 2012   LEG SURGERY Right    hardware removal   PROSTATE BIOPSY     TRUS/BX  BPH only   TONSILLECTOMY  1970    His Family History Is Significant For: Family History  Problem Relation Age of Onset   Diabetes Mother    Heart attack Father 25       massive /died   Other Father        blood infection   Breast cancer Sister        mid 52's   Colon cancer Neg Hx    Colon polyps Neg Hx    Esophageal cancer Neg Hx    Rectal cancer Neg Hx    Stomach cancer Neg Hx    Sleep apnea Neg Hx    Restless legs syndrome Neg Hx     His Social History Is Significant For: Social History    Socioeconomic History   Marital status: Married    Spouse name: Shona   Number of children: 3   Years of education: Not on file   Highest education level: Associate degree: occupational, Scientist, product/process development, or vocational program  Occupational History   Occupation: SCHEDULING/planner    Employer: [employer]  Tobacco Use   Smoking status: Former    Current packs/day: 0.00    Types: Cigarettes    Start date: 05/29/1980    Quit date: 05/29/1984    Years since quitting: 40.0   Smokeless tobacco: Never  Vaping Use   Vaping status: Never Used  Substance and Sexual Activity   Alcohol use: Not Currently   Drug use: No   Sexual activity: Yes  Other Topics Concern   Not on file  Social History Narrative   Pt lives wife    Retired    Chief Executive Officer Drivers of Corporate investment banker Strain: Low Risk  (12/29/2023)   Overall Financial Resource Strain (CARDIA)    Difficulty of Paying Living Expenses: Not hard at all  Food Insecurity: No Food Insecurity (12/29/2023)   Hunger Vital Sign    Worried About Running Out of Food in the Last Year: Never true    Ran Out of Food in the Last Year: Never true  Transportation Needs: No Transportation Needs (12/29/2023)   PRAPARE - Administrator, Civil Service (Medical): No    Lack of Transportation (Non-Medical): No  Physical Activity: Insufficiently Active (12/29/2023)   Exercise Vital Sign    Days of Exercise per Week: 7 days    Minutes of Exercise per Session: 20 min  Stress: Stress Concern Present (12/29/2023)   [employer] of Occupational Health - Occupational Stress Questionnaire    Feeling of Stress : Rather much  Social Connections: Socially Isolated (12/29/2023)   Social Connection and Isolation Panel    Frequency of Communication with Friends and Family: Once a week    Frequency of Social Gatherings with Friends and Family: Once a week    Attends Religious Services: Never    Database administrator or Organizations:  No    Attends Banker Meetings: Never    Marital Status: Married    His Allergies Are:  No Known Allergies:   His Current Medications Are:  Outpatient Encounter Medications as of 05/20/2024  Medication Sig   ARIPiprazole  (ABILIFY ) 5 MG tablet Take 1 tablet (5 mg total) by mouth daily.  buPROPion  (WELLBUTRIN  XL) 300 MG 24 hr tablet Take 1 tablet (300 mg total) by mouth daily. TAKE 1 TABLET BY MOUTH ONCE DAILY   glipiZIDE  (GLUCOTROL ) 5 MG tablet Take 1 tablet (5 mg total) by mouth 2 (two) times daily before a meal.   glucose 5 g chewable tablet Chew 15 g by mouth as needed for low blood sugar.   Glucose Blood (BLOOD GLUCOSE TEST STRIPS) STRP 1 each by In Vitro route 2 (two) times daily.   lisinopril -hydrochlorothiazide  (ZESTORETIC ) 10-12.5 MG tablet Take 1 tablet by mouth daily.   metFORMIN  (GLUCOPHAGE ) 500 MG tablet Take 1 tablet (500 mg total) by mouth 2 (two) times daily with a meal.   OneTouch Delica Lancets 33G MISC Test BS BID Dx E11.9   pantoprazole  (PROTONIX ) 40 MG tablet Take 1 tablet (40 mg total) by mouth daily.   PARoxetine  (PAXIL ) 20 MG tablet Take 1 tablet (20 mg total) by mouth daily.   rosuvastatin  (CRESTOR ) 10 MG tablet Take 1 tablet (10 mg total) by mouth daily.   sildenafil  (VIAGRA ) 100 MG tablet Take 1 tablet (100 mg total) by mouth daily as needed for erectile dysfunction.   HYDROcodone -acetaminophen  (NORCO/VICODIN) 5-325 MG tablet Take 1 tablet by mouth every 6 (six) hours as needed.   pramipexole  (MIRAPEX ) 0.5 MG tablet Take 1 tablet (0.5 mg total) by mouth 3 (three) times daily.   tiZANidine (ZANAFLEX) 4 MG tablet    No facility-administered encounter medications on file as of 05/20/2024.  :   Review of Systems:  Out of a complete 14 point review of systems, all are reviewed and negative with the exception of these symptoms as listed below:   Review of Systems  Neurological:        Pt  here for sleep consult Pt controlled hypertension, Pt denies  snoring,fatigue,headaches,cpap machine Pt had sleep study 20 years ago Pt states RLS      Ess:2 Fss:13    Objective:  Neurological Exam  Physical Exam Physical Examination:   Vitals:   05/20/24 1251  BP: 128/78  Pulse: 79    General Examination: The patient is a very pleasant 68 y.o. male in no acute distress. He appears well-developed and well-nourished and well groomed.   HEENT: Normocephalic, atraumatic, pupils are equal, round and reactive to light, extraocular tracking is good without limitation to gaze excursion or nystagmus noted. Hearing is grossly intact. Face is symmetric with normal facial animation. Speech is clear with no dysarthria noted. There is no hypophonia. There is no lip, neck/head, jaw or voice tremor. Neck is supple with full range of passive and active motion. There are no carotid bruits on auscultation. Oropharynx exam reveals: moderate mouth dryness, edentulous, moderate airway crowding secondary to small airway entry and prominent uvula, Mallampati class III, tonsils absent.  Tongue protrudes centrally and palate elevates symmetrically.  Neck circumference 16-1/2 inches.   Chest: Clear to auscultation without wheezing, rhonchi or crackles noted.  Heart: S1+S2+0, regular and normal without murmurs, rubs or gallops noted.   Abdomen: Soft, non-tender and non-distended.  Extremities: There is no pitting edema in the distal lower extremities bilaterally.   Skin: Warm and dry without trophic changes noted.   Musculoskeletal: exam reveals unremarkable right knee replacement scar, ankle surgery scar on the right.    Neurologically:  Mental status: The patient is awake, alert and oriented in all 4 spheres. His immediate and remote memory, attention, language skills and fund of knowledge are appropriate. There is no evidence of aphasia,  agnosia, apraxia or anomia. Speech is clear with normal prosody and enunciation. Thought process is linear. Mood is normal and  affect is normal.  Cranial nerves II - XII are as described above under HEENT exam.  Motor exam: Normal bulk, strength and tone is noted. There is no obvious action or resting tremor.  Fine motor skills and coordination: grossly intact.  Cerebellar testing: No dysmetria or intention tremor. There is no truncal or gait ataxia.  Sensory exam: intact to light touch in the upper and lower extremities.  Gait, station and balance: He stands easily. No veering to one side is noted. No leaning to one side is noted. Posture is age-appropriate and stance is narrow based. Gait shows normal stride length and normal pace. No problems turning are noted.   Assessment and Plan:   In summary, Sean Price is a 68 year old male with an underlying medical history of reflux disease, hypertension, hyperlipidemia, coronary artery disease with status post MI, degenerative neck disease with status post surgery, status post carpal tunnel surgery, status post tonsillectomy, bipolar disorder, ADD, anxiety, atrial fibrillation and overweight state, who presents for evaluation of his sleep disturbance, including a several year history of restless leg syndrome.  He did not find any relief with a dopamine agonist.  We talked about triggers for restless legs including sleep disordered breathing and certain medications such as SSRI type antidepressants including Paxil .  He is advised to follow-up with you as scheduled and at the next visit, I would recommend checking his thyroid function test, B12 level and his iron studies.  He is no longer on pramipexole  which he did not find helpful.  He is advised to cut back on his caffeine as this may trigger his restless leg symptoms but also exacerbate his anxiety which still bothers him.  He is advised to proceed with a sleep study to evaluate his sleep disturbance and if he has obstructive sleep apnea we can consider treatment with a CPAP or AutoPap machine.  Treating sleep apnea can  improve restless leg symptoms.  He is willing to proceed.  I talked to him about the risks and ramifications of untreated obstructive sleep apnea as well today.  We talked about alternative treatment options to PAP therapy also.  We will plan to follow-up after testing.  I answered all his questions today and he was in agreement.  Thank you very much for allowing me to participate in the care of this nice patient. If I can be of any further assistance to you please do not hesitate to call me at [phone].  Sincerely,   True Mar, MD, PhD

## 2024-05-20 NOTE — Patient Instructions (Signed)

## 2024-05-21 DIAGNOSIS — F1290 Cannabis use, unspecified, uncomplicated: Secondary | ICD-10-CM | POA: Diagnosis not present

## 2024-05-21 DIAGNOSIS — F411 Generalized anxiety disorder: Secondary | ICD-10-CM | POA: Diagnosis not present

## 2024-05-23 ENCOUNTER — Telehealth: Payer: Self-pay | Admitting: Neurology

## 2024-05-23 NOTE — Telephone Encounter (Signed)
 NPSG [employer] pending

## 2024-05-28 NOTE — Telephone Encounter (Signed)
 NPSG [employer] medicare shara: [id number] (exp. 05/23/24 to 11/20/24)

## 2024-05-29 NOTE — Telephone Encounter (Signed)
 NPSG- [employer] medicare shara: [id number] (exp. 05/23/24 to 11/20/24)   Patient is scheduled at [facility] for 06/26/24 at 9 pm.   Mailed packet and sent mychart.

## 2024-06-17 DIAGNOSIS — F411 Generalized anxiety disorder: Secondary | ICD-10-CM | POA: Diagnosis not present

## 2024-06-17 DIAGNOSIS — F1290 Cannabis use, unspecified, uncomplicated: Secondary | ICD-10-CM | POA: Diagnosis not present

## 2024-06-20 ENCOUNTER — Encounter: Payer: Self-pay | Admitting: Family Medicine

## 2024-06-20 ENCOUNTER — Ambulatory Visit (INDEPENDENT_AMBULATORY_CARE_PROVIDER_SITE_OTHER): Admitting: Family Medicine

## 2024-06-20 VITALS — BP 135/88 | HR 65 | Temp 97.6°F | Ht 70.0 in | Wt 196.0 lb

## 2024-06-20 DIAGNOSIS — I152 Hypertension secondary to endocrine disorders: Secondary | ICD-10-CM

## 2024-06-20 DIAGNOSIS — E785 Hyperlipidemia, unspecified: Secondary | ICD-10-CM

## 2024-06-20 DIAGNOSIS — Z7984 Long term (current) use of oral hypoglycemic drugs: Secondary | ICD-10-CM | POA: Diagnosis not present

## 2024-06-20 DIAGNOSIS — E119 Type 2 diabetes mellitus without complications: Secondary | ICD-10-CM

## 2024-06-20 DIAGNOSIS — E1169 Type 2 diabetes mellitus with other specified complication: Secondary | ICD-10-CM

## 2024-06-20 DIAGNOSIS — E1159 Type 2 diabetes mellitus with other circulatory complications: Secondary | ICD-10-CM | POA: Diagnosis not present

## 2024-06-20 DIAGNOSIS — Z125 Encounter for screening for malignant neoplasm of prostate: Secondary | ICD-10-CM

## 2024-06-20 LAB — BAYER DCA HB A1C WAIVED: HB A1C (BAYER DCA - WAIVED): 6.4 % — ABNORMAL HIGH (ref 4.8–5.6)

## 2024-06-20 LAB — LIPID PANEL

## 2024-06-20 MED ORDER — ROSUVASTATIN CALCIUM 10 MG PO TABS
10.0000 mg | ORAL_TABLET | Freq: Every day | ORAL | 3 refills | Status: AC
Start: 2024-06-20 — End: ?

## 2024-06-20 NOTE — Progress Notes (Signed)
 BP 135/88   Pulse 65   Temp 97.6 F (36.4 C)   Ht 5' 10 (1.778 m)   Wt 196 lb (88.9 kg)   SpO2 98%   BMI 28.12 kg/m    Subjective:   Patient ID: Sean Price, male    DOB: 1956-08-25, 68 y.o.   MRN: [id number]  HPI: Sean Price is a 68 y.o. male presenting on 06/20/2024 for Medical Management of Chronic Issues, Diabetes, Hyperlipidemia, and Hypertension   Discussed the use of AI scribe software for clinical note transcription with the patient, who gave verbal consent to proceed.  History of Present Illness   Sean Price is a 68 year old male with diabetes who presents for a recheck of his condition.  He has been consulting with a psychiatrist online and has experienced changes in his medication regimen. His Wellbutrin  dose was decreased, and Paxil  was increased. This adjustment has improved his mental clarity but has led to significant fatigue, describing himself as a 'couch potato,' particularly on cloudy days.  He continues to manage his diabetes with glipizide  and metformin . Blood sugar levels have been mostly stable, ranging from 100 to 125 mg/dL, with occasional spikes up to 188 mg/dL. He is also on lisinopril  hydrochlorothiazide  for blood pressure management and rosuvastatin  for cholesterol control. Blood pressure readings at home have been well-controlled, ranging from 120s to 130s.          Relevant past medical, surgical, family and social history reviewed and updated as indicated. Interim medical history since our last visit reviewed. Allergies and medications reviewed and updated.  Review of Systems  Constitutional:  Negative for chills and fever.  Eyes:  Negative for visual disturbance.  Respiratory:  Negative for shortness of breath and wheezing.   Cardiovascular:  Negative for chest pain and leg swelling.  Musculoskeletal:  Negative for back pain and gait problem.  Skin:  Negative for rash.  Neurological:  Negative for dizziness and  light-headedness.  All other systems reviewed and are negative.   Per HPI unless specifically indicated above   Allergies as of 06/20/2024   No Known Allergies      Medication List        Accurate as of June 20, 2024  8:45 AM. If you have any questions, ask your nurse or doctor.          STOP taking these medications    HYDROcodone -acetaminophen  5-325 MG tablet Commonly known as: NORCO/VICODIN Stopped by: Fonda LABOR Kamea Dacosta   pramipexole  0.5 MG tablet Commonly known as: MIRAPEX  Stopped by: Fonda LABOR Marialena Wollen   tiZANidine 4 MG tablet Commonly known as: ZANAFLEX Stopped by: Keyondra Lagrand A Janika Jedlicka       TAKE these medications    ARIPiprazole  5 MG tablet Commonly known as: ABILIFY  Take 1 tablet (5 mg total) by mouth daily.   BLOOD GLUCOSE TEST STRIPS Strp 1 each by In Vitro route 2 (two) times daily.   buPROPion  300 MG 24 hr tablet Commonly known as: WELLBUTRIN  XL Take 1 tablet (300 mg total) by mouth daily. TAKE 1 TABLET BY MOUTH ONCE DAILY   glipiZIDE  5 MG tablet Commonly known as: GLUCOTROL  Take 1 tablet (5 mg total) by mouth 2 (two) times daily before a meal.   glucose 5 g chewable tablet Chew 15 g by mouth as needed for low blood sugar.   lisinopril -hydrochlorothiazide  10-12.5 MG tablet Commonly known as: ZESTORETIC  Take 1 tablet by mouth daily.   metFORMIN  500 MG tablet  Commonly known as: GLUCOPHAGE  Take 1 tablet (500 mg total) by mouth 2 (two) times daily with a meal.   OneTouch Delica Lancets 33G Misc Test BS BID Dx E11.9   pantoprazole  40 MG tablet Commonly known as: PROTONIX  Take 1 tablet (40 mg total) by mouth daily.   PARoxetine  20 MG tablet Commonly known as: PAXIL  Take 1 tablet (20 mg total) by mouth daily.   rosuvastatin  10 MG tablet Commonly known as: CRESTOR  Take 1 tablet (10 mg total) by mouth daily.   sildenafil  100 MG tablet Commonly known as: Viagra  Take 1 tablet (100 mg total) by mouth daily as needed for erectile  dysfunction.         Objective:   BP 135/88   Pulse 65   Temp 97.6 F (36.4 C)   Ht 5' 10 (1.778 m)   Wt 196 lb (88.9 kg)   SpO2 98%   BMI 28.12 kg/m   Wt Readings from Last 3 Encounters:  06/20/24 196 lb (88.9 kg)  05/20/24 191 lb (86.6 kg)  03/18/24 185 lb (83.9 kg)    Physical Exam Physical Exam   VITALS: BP- 135/88 NECK: Thyroid normal. CHEST: Lungs clear to auscultation bilaterally. CARDIOVASCULAR: Heart regular rate and rhythm. ABDOMEN: No costovertebral angle tenderness. EXTREMITIES: Good sensation and pulses in both feet.         Assessment & Plan:   Problem List Items Addressed This Visit       Cardiovascular and Mediastinum   Hypertension associated with diabetes (HCC) - Primary   Relevant Medications   rosuvastatin  (CRESTOR ) 10 MG tablet   Other Relevant Orders   Bayer DCA Hb A1c Waived   CBC with Differential/Platelet   CMP14+EGFR   Lipid panel     Endocrine   Type 2 diabetes mellitus without complications (HCC)   Relevant Medications   rosuvastatin  (CRESTOR ) 10 MG tablet   Hyperlipidemia associated with type 2 diabetes mellitus (HCC)   Relevant Medications   rosuvastatin  (CRESTOR ) 10 MG tablet   Other Relevant Orders   Bayer DCA Hb A1c Waived   CBC with Differential/Platelet   CMP14+EGFR   Lipid panel   Other Visit Diagnoses       Prostate cancer screening       Relevant Orders   PSA, total and free          Type 2 diabetes mellitus Occasional elevated blood glucose levels reaching 180 mg/dL, generally well-controlled with levels between 100-125 mg/dL. Current medication regimen includes glipizide  and metformin . - Continue glipizide  and metformin . - Check A1c level today. - Follow up in three months. - A1c looks good at 6.4, no changes  Depression Recent medication adjustment by psychiatrist. Paxil  increased and Wellbutrin  decreased, leading to improved mental clarity but increased fatigue. - Monitor symptoms and energy  levels until September 15th. - Discuss medication effects with psychiatrist on September 15th.  Hypertension Well-controlled with lisinopril  and hydrochlorothiazide . Home blood pressure readings consistently in the 120s-130s range. - Continue lisinopril  and hydrochlorothiazide . - Follow up in three months.  Hyperlipidemia Managed with rosuvastatin  (Crestor ). - Continue rosuvastatin  (Crestor ). - Check cholesterol levels today.          Follow up plan: Return in about 3 months (around 09/20/2024), or if symptoms worsen or fail to improve, for Diabetes recheck.  Counseling provided for all of the vaccine components Orders Placed This Encounter  Procedures   Bayer DCA Hb A1c Waived   CBC with Differential/Platelet   CMP14+EGFR   Lipid panel  PSA, total and free    Fonda Levins, MD [facility] 06/20/2024, 8:45 AM

## 2024-06-21 LAB — LIPID PANEL
Cholesterol, Total: 147 mg/dL (ref 100–199)
HDL: 63 mg/dL (ref >39)
LDL CALC COMMENT:: 2.3 ratio (ref 0.0–5.0)
LDL Chol Calc (NIH): 66 mg/dL (ref 0–99)
Triglycerides: 99 mg/dL (ref 0–149)
VLDL Cholesterol Cal: 18 mg/dL (ref 5–40)

## 2024-06-21 LAB — CMP14+EGFR
ALT: 17 IU/L (ref 0–44)
AST: 15 IU/L (ref 0–40)
Albumin: 4.3 g/dL (ref 3.9–4.9)
Alkaline Phosphatase: 93 IU/L (ref 44–121)
BUN/Creatinine Ratio: 15 (ref 10–24)
BUN: 19 mg/dL (ref 8–27)
Bilirubin Total: 0.3 mg/dL (ref 0.0–1.2)
CO2: 25 mmol/L (ref 20–29)
Calcium: 9.6 mg/dL (ref 8.6–10.2)
Chloride: 99 mmol/L (ref 96–106)
Creatinine, Ser: 1.23 mg/dL (ref 0.76–1.27)
Globulin, Total: 1.9 g/dL (ref 1.5–4.5)
Glucose: 157 mg/dL — ABNORMAL HIGH (ref 70–99)
Potassium: 5.1 mmol/L (ref 3.5–5.2)
Sodium: 139 mmol/L (ref 134–144)
Total Protein: 6.2 g/dL (ref 6.0–8.5)
eGFR: 64 mL/min/1.73 (ref >59)

## 2024-06-21 LAB — CBC WITH DIFFERENTIAL/PLATELET
Basophils Absolute: 0.1 x10E3/uL (ref 0.0–0.2)
Basos: 1 %
EOS (ABSOLUTE): 0.3 x10E3/uL (ref 0.0–0.4)
Eos: 5 %
Hematocrit: 42.2 % (ref 37.5–51.0)
Hemoglobin: 13.7 g/dL (ref 13.0–17.7)
Immature Grans (Abs): 0.1 x10E3/uL (ref 0.0–0.1)
Immature Granulocytes: 1 %
Lymphocytes Absolute: 2.1 x10E3/uL (ref 0.7–3.1)
Lymphs: 31 %
MCH: 29.8 pg (ref 26.6–33.0)
MCHC: 32.5 g/dL (ref 31.5–35.7)
MCV: 92 fL (ref 79–97)
Monocytes Absolute: 0.7 x10E3/uL (ref 0.1–0.9)
Monocytes: 10 %
Neutrophils Absolute: 3.6 x10E3/uL (ref 1.4–7.0)
Neutrophils: 52 %
Platelets: 209 x10E3/uL (ref 150–450)
RBC: 4.6 x10E6/uL (ref 4.14–5.80)
RDW: 13.1 % (ref 11.6–15.4)
WBC: 6.8 x10E3/uL (ref 3.4–10.8)

## 2024-06-21 LAB — PSA, TOTAL AND FREE
PSA, Free Pct: 12.1
PSA, Free: 1.08 ng/mL
Prostate Specific Ag, Serum: 8.9 ng/mL — AB (ref 0.0–4.0)

## 2024-06-26 ENCOUNTER — Ambulatory Visit (INDEPENDENT_AMBULATORY_CARE_PROVIDER_SITE_OTHER): Admitting: Neurology

## 2024-06-26 DIAGNOSIS — E663 Overweight: Secondary | ICD-10-CM

## 2024-06-26 DIAGNOSIS — Z9189 Other specified personal risk factors, not elsewhere classified: Secondary | ICD-10-CM

## 2024-06-26 DIAGNOSIS — G4730 Sleep apnea, unspecified: Secondary | ICD-10-CM

## 2024-06-26 DIAGNOSIS — Z8679 Personal history of other diseases of the circulatory system: Secondary | ICD-10-CM

## 2024-06-26 DIAGNOSIS — G2581 Restless legs syndrome: Secondary | ICD-10-CM

## 2024-06-26 DIAGNOSIS — G4709 Other insomnia: Secondary | ICD-10-CM

## 2024-06-26 DIAGNOSIS — R351 Nocturia: Secondary | ICD-10-CM

## 2024-06-26 DIAGNOSIS — G478 Other sleep disorders: Secondary | ICD-10-CM

## 2024-06-26 DIAGNOSIS — G4720 Circadian rhythm sleep disorder, unspecified type: Secondary | ICD-10-CM

## 2024-06-26 DIAGNOSIS — G4761 Periodic limb movement disorder: Secondary | ICD-10-CM

## 2024-06-27 ENCOUNTER — Ambulatory Visit: Payer: Self-pay | Admitting: Family Medicine

## 2024-06-27 DIAGNOSIS — R972 Elevated prostate specific antigen [PSA]: Secondary | ICD-10-CM

## 2024-07-03 NOTE — Procedures (Unsigned)
 Physician Interpretation: Please see link under Procedure Tab or under Encounters tab for physician report, technical report, as well as O2 titration and/or PAP titration tables (if applicable).   Referred by: Dr. Modena Callander   History and Indication for Testing (obtained from visit note dated 05/02/2024): 68 year old male with an underlying complex medical history of stroke in May 2025, hypertension, hyperlipidemia, coronary artery disease, prior smoking, COPD, history of kidney stone, carotid artery disease with status post bilateral carotid endarterectomies, hypothyroidism, and overweight state, who reports snoring and nocturia. His Epworth sleepiness score is 3 out of 24, fatigue severity score is 15 out of 63.     Review of the EEG showed no abnormal electrical discharges and symmetrical bihemispheric findings.     EKG: The EKG revealed normal sinus rhythm (NSR). ***   AUDIO/VIDEO REVIEW: The audio and video review did not show any abnormal or unusual behaviors, movements, phonations or vocalizations. The patient took *** restroom breaks. Snoring was noted, ***   POST-STUDY QUESTIONNAIRE: Post study, the patient indicated, that sleep was *** the same as usual.    IMPRESSION:    Obstructive Sleep Apnea (OSA), *** ***Central Sleep Apnea (CSA) ***Primary Snoring ***Primary Central Sleep Apnea ***Complex Sleep Apnea ***PLMD (periodic limb movement disorder [of sleep]) ***Dysfunctions associated with sleep stages or arousal from sleep ***Non-specific abnormal electrocardiogram (EKG) ***Poor sleep pattern ***Inconclusive Test   RECOMMENDATIONS:         I certify that I have reviewed the entire raw data recording prior to the issuance of this report in accordance with the Standards of Accreditation of the American Academy of Sleep Medicine (AASM).   True Mar, MD, PhD Medical Director, [facility]) Diplomat, ABPN (Neurology and  Sleep)

## 2024-07-05 ENCOUNTER — Ambulatory Visit: Payer: Self-pay | Admitting: Neurology

## 2024-07-05 DIAGNOSIS — Z8679 Personal history of other diseases of the circulatory system: Secondary | ICD-10-CM

## 2024-07-05 DIAGNOSIS — G4730 Sleep apnea, unspecified: Secondary | ICD-10-CM

## 2024-07-05 DIAGNOSIS — Z9189 Other specified personal risk factors, not elsewhere classified: Secondary | ICD-10-CM

## 2024-07-05 DIAGNOSIS — R351 Nocturia: Secondary | ICD-10-CM

## 2024-07-05 DIAGNOSIS — G2581 Restless legs syndrome: Secondary | ICD-10-CM

## 2024-07-05 DIAGNOSIS — G4709 Other insomnia: Secondary | ICD-10-CM

## 2024-07-10 NOTE — Telephone Encounter (Signed)
Noted, HST [employer] no auth req.

## 2024-07-10 NOTE — Telephone Encounter (Signed)
-----   Message from True Mar sent at 07/05/2024  1:28 PM EDT ----- Pt had a PSG attempted on 06/26/24. Unfortunately, he did not sleep enough. There was evidence of sleep disordered breathing and leg movements (in keeping with his Hx of RLS). I recommend we proceed  with a HST. I have placed an order for this, pls let pt know. We will have to see in ins will approve it. I will copy Meagan and Damien on this.  ----- Message ----- From: Rebecka Fleeta Higashi In One Three One Sent: 07/05/2024   1:24 PM EDT To: True Mar, MD

## 2024-07-15 DIAGNOSIS — G4700 Insomnia, unspecified: Secondary | ICD-10-CM | POA: Diagnosis not present

## 2024-07-15 DIAGNOSIS — F411 Generalized anxiety disorder: Secondary | ICD-10-CM | POA: Diagnosis not present

## 2024-07-15 DIAGNOSIS — F1290 Cannabis use, unspecified, uncomplicated: Secondary | ICD-10-CM | POA: Diagnosis not present

## 2024-07-23 ENCOUNTER — Other Ambulatory Visit: Payer: Self-pay | Admitting: *Deleted

## 2024-07-23 DIAGNOSIS — F320 Major depressive disorder, single episode, mild: Secondary | ICD-10-CM

## 2024-07-30 ENCOUNTER — Ambulatory Visit: Admitting: Neurology

## 2024-07-30 DIAGNOSIS — G4733 Obstructive sleep apnea (adult) (pediatric): Secondary | ICD-10-CM

## 2024-07-30 DIAGNOSIS — R351 Nocturia: Secondary | ICD-10-CM

## 2024-07-30 DIAGNOSIS — G4709 Other insomnia: Secondary | ICD-10-CM

## 2024-07-30 DIAGNOSIS — G2581 Restless legs syndrome: Secondary | ICD-10-CM

## 2024-07-30 DIAGNOSIS — Z9189 Other specified personal risk factors, not elsewhere classified: Secondary | ICD-10-CM

## 2024-07-30 DIAGNOSIS — G4730 Sleep apnea, unspecified: Secondary | ICD-10-CM

## 2024-07-30 DIAGNOSIS — Z8679 Personal history of other diseases of the circulatory system: Secondary | ICD-10-CM

## 2024-08-01 ENCOUNTER — Ambulatory Visit: Payer: Self-pay | Admitting: Neurology

## 2024-08-01 DIAGNOSIS — Z1212 Encounter for screening for malignant neoplasm of rectum: Secondary | ICD-10-CM | POA: Diagnosis not present

## 2024-08-01 DIAGNOSIS — Z1211 Encounter for screening for malignant neoplasm of colon: Secondary | ICD-10-CM | POA: Diagnosis not present

## 2024-08-01 DIAGNOSIS — G4733 Obstructive sleep apnea (adult) (pediatric): Secondary | ICD-10-CM

## 2024-08-01 LAB — COLOGUARD: Cologuard: POSITIVE — AB

## 2024-08-01 NOTE — Procedures (Signed)
 [facility]  HOME SLEEP TEST (Watch PAT) REPORT  STUDY DATE: 07/30/2024  DOB: 1956/04/17  MRN: [id number]  ORDERING CLINICIAN: True Mar, MD, PhD   REFERRING CLINICIAN: Dettinger, Fonda LABOR, MD   CLINICAL INFORMATION/HISTORY (obtained from visit note dated 05/20/2024): 67 year old male with an underlying medical history of reflux disease, hypertension, hyperlipidemia, coronary artery disease with status post MI, degenerative neck disease with status post surgery, status post carpal tunnel surgery, status post tonsillectomy, bipolar disorder, ADD, anxiety, atrial fibrillation and overweight state, who reports a longstanding history of restless leg syndrome and sleep disruption.  An attempt at laboratory attended sleep study in August 2025 was inconclusive due to lack of total sleep time.  He presents for reevaluation with a home sleep test.  Epworth sleepiness score: 2/24.  BMI: 27.4 kg/m  FINDINGS:   Sleep Summary:   Total Recording Time (hours, min): 9 hours, 59 min  Total Sleep Time (hours, min):  8 hours, 9 min  Percent REM (%):    16.3%   Respiratory Indices:   Calculated pAHI (per hour):  10.8/hour         REM pAHI:    8.4/hour       NREM pAHI: 11.3/hour  Central pAHI: 3.8/hour  Oxygen Saturation Statistics:    Oxygen Saturation (%) Mean: 94%   Minimum oxygen saturation (%):                 86%   O2 Saturation Range (%): 86-98%    O2 Saturation (minutes) <=88%: 0.4 min  Pulse Rate Statistics:   Pulse Mean (bpm):    60/min    Pulse Range (46-93/min)   IMPRESSION: OSA (obstructive sleep apnea), mild   RECOMMENDATION:  This home sleep test demonstrates overall mild obstructive sleep apnea with a total AHI of 10.8/hour and O2 nadir of 86%.  Variable snoring was detected, ranging from mild to louder.  Given the patient's medical history and sleep related complaints, therapy with a positive airway pressure device is a reasonable  first-line choice and clinically recommended. Treatment can be achieved in the form of autoPAP trial/titration at home for now. A full night, in-lab PAP titration study may aid in improving proper treatment settings and with mask fit, if needed, down the road.  Alternative treatments may include weight loss (where appropriate) along with avoidance of the supine sleep position (if possible), or an oral appliance in appropriate candidates.   Please note that untreated obstructive sleep apnea may carry additional perioperative morbidity. Patients with significant obstructive sleep apnea should receive perioperative PAP therapy and the surgeons and particularly the anesthesiologist should be informed of the diagnosis and the severity of the sleep disordered breathing. The patient should be cautioned not to drive, work at heights, or operate dangerous or heavy equipment when tired or sleepy. Review and reiteration of good sleep hygiene measures should be pursued with any patient. Other causes of the patient's symptoms, including circadian rhythm disturbances, an underlying mood disorder, medication effect and/or an underlying medical problem cannot be ruled out based on this test. Clinical correlation is recommended.  The patient and his referring provider will be notified of the test results. The patient will be seen in follow up in sleep clinic at [facility], as necessary.  I certify that I have reviewed the raw data recording prior to the issuance of this report in accordance with the standards of the American Academy of Sleep Medicine (AASM).    INTERPRETING PHYSICIAN:   True Mar,  MD, PhD Medical Director, [facility]) Diplomat, ABPN (Neurology and Sleep)   [facility] [address] [place], [place] [address] [phone]

## 2024-08-01 NOTE — Progress Notes (Signed)
 See procedure note.

## 2024-08-05 NOTE — Telephone Encounter (Signed)
-----   Message from True Mar sent at 08/01/2024  6:36 PM EDT ----- Patient was referred by his PCP and was seen on 05/20/2024 with a longstanding history of restless leg syndrome.  He did not sleep enough for his laboratory attended sleep study in August 2025 and  therefore was asked to proceed with a home sleep test.  He had a home sleep test on 07/30/2024.  Please advise patient that it showed mild obstructive sleep apnea.  I would recommend treating this  with an AutoPap machine at home to see if he benefits from treatment and sleeps better, it may also reduce his restless leg symptoms.  To that end, I have placed an order for AutoPap therapy for home  use, please process order after talking to patient if he is agreeable to proceeding with treatment.  Please go over compliance expectations with him as well and arrange for follow-up in clinic to see  one of our nurse practitioners or myself for his initial compliance visit in about 30 to 90 days after set up. ----- Message ----- From: Mar True, MD Sent: 08/01/2024   6:33 PM EDT To: True Mar, MD

## 2024-08-05 NOTE — Telephone Encounter (Signed)
 Unable to lvm 1st attempt by hf 08/05/24

## 2024-08-07 NOTE — Telephone Encounter (Signed)
 Made a f/u appointment 09/2024 Pt was appreciative and thanked me for calling

## 2024-08-07 NOTE — Telephone Encounter (Signed)
-----   Message from True Mar sent at 08/07/2024  1:15 PM EDT ----- Please offer FU appt to discuss OSA and RLS management. ----- Message ----- From: Shona Rojean LABOR, CMA Sent: 08/07/2024   1:04 PM EDT To: True Mar, MD   ----- Message ----- From: Mar True, MD Sent: 08/01/2024   6:36 PM EDT To: Gna-Pod 4 Results  Patient was referred by his PCP and was seen on 05/20/2024 with a longstanding history of restless leg syndrome.  He did not sleep enough for his laboratory attended sleep study in August 2025 and  therefore was asked to proceed with a home sleep test.  He had a home sleep test on 07/30/2024.  Please advise patient that it showed mild obstructive sleep apnea.  I would recommend treating this  with an AutoPap machine at home to see if he benefits from treatment and sleeps better, it may also reduce his restless leg symptoms.  To that end, I have placed an order for AutoPap therapy for home  use, please process order after talking to patient if he is agreeable to proceeding with treatment.  Please go over compliance expectations with him as well and arrange for follow-up in clinic to see  one of our nurse practitioners or myself for his initial compliance visit in about 30 to 90 days after set up. ----- Message ----- From: Mar True, MD Sent: 08/01/2024   6:33 PM EDT To: True Mar, MD

## 2024-08-08 LAB — COLOGUARD: COLOGUARD: POSITIVE — AB

## 2024-08-09 ENCOUNTER — Encounter: Payer: Self-pay | Admitting: Internal Medicine

## 2024-08-12 DIAGNOSIS — F411 Generalized anxiety disorder: Secondary | ICD-10-CM | POA: Diagnosis not present

## 2024-08-12 DIAGNOSIS — G4700 Insomnia, unspecified: Secondary | ICD-10-CM | POA: Diagnosis not present

## 2024-08-14 ENCOUNTER — Ambulatory Visit: Payer: Self-pay

## 2024-08-14 DIAGNOSIS — K2900 Acute gastritis without bleeding: Secondary | ICD-10-CM | POA: Diagnosis not present

## 2024-08-14 DIAGNOSIS — R1031 Right lower quadrant pain: Secondary | ICD-10-CM | POA: Diagnosis not present

## 2024-08-14 NOTE — Telephone Encounter (Signed)
 Noted

## 2024-08-14 NOTE — Telephone Encounter (Signed)
 FYI Only or Action Required?: FYI only for provider.  Patient was last seen in primary care on 06/20/2024 by Dettinger, Fonda LABOR, MD.  Called Nurse Triage reporting Abdominal Pain.  Symptoms began a week ago.  Interventions attempted: Rest, hydration, or home remedies and Dietary changes.  Symptoms are: gradually worsening.  Triage Disposition: Go to ED Now (Notify PCP)  Patient/caregiver understands and will follow disposition?: Yes  Copied from CRM [id number]. Topic: Clinical - Red Word Triage >> Aug 14, 2024 12:28 PM Sean Price wrote: Red Word that prompted transfer to Nurse Triage: Wife Sean Price states patient is having severe stomach pain, unable to hold anything down. Reason for Disposition  [1] Pain lasts > 10 minutes AND [2] age > 51  Answer Assessment - Initial Assessment Questions Pt reports wife can drive him to ED today.  1. LOCATION: Where does it hurt?      Upper belly  2. RADIATION: Does the pain shoot anywhere else? (e.g., chest, back)     Also feels like theres a painful lump in his throat starting 1 week ago. No dysphagia or increased SOB now. Denies CP.  3. ONSET: When did the pain begin? (e.g., minutes, hours or days ago)      1 week ago, getting worse  4. SUDDEN: Gradual or sudden onset?     Gradual  5. PATTERN Does the pain come and go, or is it constant?     Comes and goes  6. SEVERITY: How bad is the pain?  (e.g., Scale 1-10; mild, moderate, or severe)     6/10 Burning pain in upper belly.   7. RECURRENT SYMPTOM: Have you ever had this type of stomach pain before? If Yes, ask: When was the last time? and What happened that time?      No  8. AGGRAVATING FACTORS: Does anything seem to cause this pain? (e.g., foods, stress, alcohol)     Eating warm foods makes nausea worse  9. CARDIAC SYMPTOMS: Do you have any of the following symptoms: chest pain, difficulty breathing, sweating, nausea?     Was nauseous and threw up at start of  call. No s/sx now.   10. OTHER SYMPTOMS: Do you have any other symptoms? (e.g., back pain, diarrhea, fever, urination pain, vomiting)       Recently stopped smoking pot 1 week ago. Acid reflux and vomiting starting today. Vomited twice today. Pt reports sweating through sheets at night over the past week. Increased SOB and dizziness when bending over for past month.  Protocols used: Abdominal Pain - Upper-A-AH

## 2024-08-16 ENCOUNTER — Telehealth: Payer: Self-pay | Admitting: Internal Medicine

## 2024-08-16 NOTE — Telephone Encounter (Signed)
 Called and spoke with pt's spouse. Discussed that pt would need ov to determine if EGD is appropriate. She reports that the pt is still having issues including epigastric pain and nausea. The pt was prescribed dicyclomine, esomeprazole, and ondansetron in the ED and will start taking these medications today. Hospital f/u appt scheduled for 08/28/24.

## 2024-08-16 NOTE — Telephone Encounter (Signed)
 Inbound call from patients wife stating husband was at ED 08/14/24 for abdominal pain nausea and vomiting patient and wife would like to know if for his upcoming procedure in November it can be switched to Endo/Colon.  Requesting a call back  Please advise  Thank you

## 2024-08-16 NOTE — Telephone Encounter (Signed)
 Pt at [facility] on 08/14/2024 for abd pain. Dx acute gastritis without hemorrhage. Per ER notes reported burning and epigastric pain  Pt is scheduled for a recall colon on 09/17/24 at 0700. There is space on the schedule to add pt as a double, but it is at the end of the day. Will send to provider to review and advise.

## 2024-08-16 NOTE — Telephone Encounter (Signed)
 He would need to be seen in the office to determine if that was needed.

## 2024-08-19 DIAGNOSIS — Z008 Encounter for other general examination: Secondary | ICD-10-CM | POA: Diagnosis not present

## 2024-08-19 LAB — LAB REPORT - SCANNED

## 2024-08-27 ENCOUNTER — Other Ambulatory Visit: Payer: Self-pay

## 2024-08-28 ENCOUNTER — Encounter: Payer: Self-pay | Admitting: Gastroenterology

## 2024-08-28 ENCOUNTER — Ambulatory Visit: Admitting: Gastroenterology

## 2024-08-28 VITALS — BP 114/60 | HR 73 | Ht 70.0 in | Wt 199.1 lb

## 2024-08-28 DIAGNOSIS — K219 Gastro-esophageal reflux disease without esophagitis: Secondary | ICD-10-CM | POA: Diagnosis not present

## 2024-08-28 DIAGNOSIS — R112 Nausea with vomiting, unspecified: Secondary | ICD-10-CM | POA: Diagnosis not present

## 2024-08-28 DIAGNOSIS — R09A2 Foreign body sensation, throat: Secondary | ICD-10-CM

## 2024-08-28 DIAGNOSIS — R1013 Epigastric pain: Secondary | ICD-10-CM | POA: Diagnosis not present

## 2024-08-28 MED ORDER — ESOMEPRAZOLE MAGNESIUM 40 MG PO CPDR: 40.0000 mg | DELAYED_RELEASE_CAPSULE | Freq: Two times a day (BID) | ORAL | 3 refills | Status: AC

## 2024-08-28 MED ORDER — NA SULFATE-K SULFATE-MG SULF 17.5-3.13-1.6 GM/177ML PO SOLN: 1.0000 | Freq: Once | ORAL | 0 refills | Status: AC

## 2024-08-28 NOTE — Progress Notes (Signed)
 Sean Price [id number] 1956-06-28   Chief Complaint: Abdominal pain, GERD  Referring Provider: Dettinger, Fonda LABOR, MD Primary GI MD: Dr. Avram  HPI: Sean Price is a 68 y.o. male with past medical history of ADD, anxiety/depression, bipolar disorder, BPH, diabetes, GERD, HLD, HTN who presents today for ED follow up.    Patient seen at [facility] ED 08/14/2024 for abdominal pain that started a week prior.  Pain mostly epigastric but some right lower quadrant pain as well.  Had associated nausea and vomiting which started the day of presentation.  No aggravating or alleviating factors.  No acute abnormality on CT of the abdomen and pelvis.  Had bilateral indeterminate adrenal nodules measuring up to 2.4 cm on the left and recommended follow-up with nonemergent adrenal protocol CT or MRI.  EKG normal sinus rhythm.  Normal lipase, CMP unremarkable, normal CBC.  Was given dicyclomine, Nexium, and Zofran.  Patient is scheduled for repeat colonoscopy 09/17/2024.  Had been seen in the ED 08/14/2024 for abdominal pain, nausea, and vomiting as well as burning epigastric pain and wanted to know if EGD could be added.  He had a positive Cologuard on 08/01/2024.   Discussed the use of AI scribe software for clinical note transcription with the patient, who gave verbal consent to proceed.  History of Present Illness Sean Price is a 68 year old male with acid reflux and colon polyps who presents with persistent acid reflux symptoms.  He experiences persistent acid reflux symptoms despite taking Nexium once daily. He has heartburn and reflux once or twice a week, even while on medication, and has been dealing with reflux for approximately five years. Dietary changes, including quitting caffeine and chocolate, have been helpful. He has a sensation of a lump in his throat for the past month but denies difficulty swallowing or food getting stuck.  He was recently seen in the  ER for abdominal pain, nausea, and vomiting, where he underwent labs and a CT scan. He was started on medications but is currently only taking Nexium. No current nausea or vomiting.  He has a history of colon polyps and recently had a positive Cologuard test, which was ordered by his primary care doctor. He is scheduled for a colonoscopy on November 18th. He reports regular bowel movements with no blood or black stools, except when taking Pepto Bismol, which caused temporary dark stools.  He has a history of atrial fibrillation but reports no issues for the past ten years. He is not on blood thinners and has never had a heart attack. Denies shortness of breath or chest pain.  He smokes marijuana daily and has a long history of use, which he attempted to quit recently, at which time he developed nausea and vomiting. There is no family history of esophageal, stomach, or colon cancer.   Previous GI Procedures/Imaging   CT A/P 08/14/2024 1.    No acute abnormality in the abdomen or pelvis.  2.    Bilateral indeterminate adrenal nodules measuring up to 2.4 cm on the left. Recommend further evaluation with nonemergent adrenal protocol CT or MRI.  3.    Prostatomegaly.   Colonoscopy 06/13/2011 Sessile polyp in the ascending colon Sessile polyp in the sigmoid colon Otherwise normal exam Recommended recall 3 years Path: 1. Surgical [P], ascending colon, polyp - TUBULAR ADENOMA (X1). - NO HIGH GRADE DYSPLASIA OR MALIGNANCY IDENTIFIED. 2. Surgical [P], sigmoid colon, polyp - SESSILE SERRATED ADENOMA (X1). - NO HIGH GRADE DYSPLASIA OR  MALIGNANCY IDENTIFIED.  Past Medical History:  Diagnosis Date   ADD (attention deficit disorder)    Anxiety    Atrial fibrillation with RVR (HCC)    new found on 12/18/10 visit   Bipolar disorder (HCC) 01/2014   BPH with elevated PSA    Depression    Diabetes mellitus without complication (HCC)    GERD (gastroesophageal reflux disease)    Hyperlipidemia     Hypertension    Myocardial infarction [facility])    Sinus arrhythmia     Past Surgical History:  Procedure Laterality Date   bilateral inguinal hernia     CARPAL TUNNEL RELEASE  1999   left hand   CERVICAL FUSION  2007   c5-c6   COLONOSCOPY     compound fracture  1999, 2001, 2001   right leg, insertion of hardware in 2001, removal of hardware 2001   FRACTURE SURGERY     HERNIA REPAIR Bilateral 2012   LEG SURGERY Right    hardware removal   PROSTATE BIOPSY     TRUS/BX  BPH only   TONSILLECTOMY  1970    Current Outpatient Medications  Medication Sig Dispense Refill   buPROPion  (WELLBUTRIN  XL) 300 MG 24 hr tablet Take 1 tablet (300 mg total) by mouth daily. TAKE 1 TABLET BY MOUTH ONCE DAILY 90 tablet 3   esomeprazole (NEXIUM) 40 MG capsule Take 40 mg by mouth daily.     glipiZIDE  (GLUCOTROL ) 5 MG tablet Take 1 tablet (5 mg total) by mouth 2 (two) times daily before a meal. 180 tablet 3   Glucose Blood (BLOOD GLUCOSE TEST STRIPS) STRP 1 each by In Vitro route 2 (two) times daily. 180 strip 3   lisinopril -hydrochlorothiazide  (ZESTORETIC ) 10-12.5 MG tablet Take 1 tablet by mouth daily. 90 tablet 3   metFORMIN  (GLUCOPHAGE ) 500 MG tablet Take 1 tablet (500 mg total) by mouth 2 (two) times daily with a meal. 180 tablet 3   OneTouch Delica Lancets 33G MISC Test BS BID Dx E11.9 200 each 3   pantoprazole  (PROTONIX ) 40 MG tablet Take 1 tablet (40 mg total) by mouth daily. 90 tablet 3   PARoxetine  (PAXIL ) 20 MG tablet Take 1 tablet (20 mg total) by mouth daily. 90 tablet 3   rosuvastatin  (CRESTOR ) 10 MG tablet Take 1 tablet (10 mg total) by mouth daily. 90 tablet 3   sildenafil  (VIAGRA ) 100 MG tablet Take 1 tablet (100 mg total) by mouth daily as needed for erectile dysfunction. 20 tablet 1   traZODone (DESYREL) 50 MG tablet Take 50 mg by mouth at bedtime as needed.     ARIPiprazole  (ABILIFY ) 5 MG tablet Take 1 tablet (5 mg total) by mouth daily. (Patient not taking: Reported on 08/28/2024) 90  tablet 3   dicyclomine (BENTYL) 20 MG tablet Take 20 mg by mouth. (Patient not taking: Reported on 08/28/2024)     glucose 5 g chewable tablet Chew 15 g by mouth as needed for low blood sugar. (Patient not taking: Reported on 08/28/2024)     ondansetron (ZOFRAN-ODT) 4 MG disintegrating tablet Take by mouth. (Patient not taking: Reported on 08/28/2024)     No current facility-administered medications for this visit.    Allergies as of 08/28/2024   (No Known Allergies)    Family History  Problem Relation Age of Onset   Diabetes Mother    Heart attack Father 34       massive /died   Other Father        blood  infection   Breast cancer Sister        mid 40's   Colon cancer Neg Hx    Colon polyps Neg Hx    Esophageal cancer Neg Hx    Rectal cancer Neg Hx    Stomach cancer Neg Hx    Sleep apnea Neg Hx    Restless legs syndrome Neg Hx     Social History   Tobacco Use   Smoking status: Former    Current packs/day: 0.00    Types: Cigarettes    Start date: 05/29/1980    Quit date: 05/29/1984    Years since quitting: 40.2   Smokeless tobacco: Never  Vaping Use   Vaping status: Never Used  Substance Use Topics   Alcohol use: Not Currently   Drug use: No     Review of Systems:    Constitutional: No weight loss, fever, chills Cardiovascular: No chest pain, chest pressure or palpitations   Respiratory: No SOB or cough Gastrointestinal: See HPI and otherwise negative   Physical Exam:  Vital signs: BP 114/60   Pulse 73   Ht 5' 10 (1.778 m) Comment: mesured without shoes  Wt 199 lb 2 oz (90.3 kg)   BMI 28.57 kg/m   Wt Readings from Last 3 Encounters:  08/28/24 199 lb 2 oz (90.3 kg)  06/20/24 196 lb (88.9 kg)  05/20/24 191 lb (86.6 kg)     Constitutional: Pleasant, overweight male in NAD, alert and cooperative, strong odor of marijuana Head:  Normocephalic and atraumatic.  Eyes: No scleral icterus.  Respiratory: Respirations even and unlabored. Lungs clear to  auscultation bilaterally.  No wheezes, crackles, or rhonchi.  Cardiovascular:  Regular rate and rhythm. No murmurs. No peripheral edema. Gastrointestinal:  Soft, nondistended, nontender. No rebound or guarding. Normal bowel sounds. No appreciable masses or hepatomegaly. Rectal:  Not performed.  Neurologic:  Alert and oriented x4;  grossly normal neurologically.  Skin:   Dry and intact without significant lesions or rashes. Psychiatric: Oriented to person, place and time. Demonstrates good judgement and reason without abnormal affect or behaviors.   RELEVANT LABS AND IMAGING: CBC    Component Value Date/Time   WBC 6.8 06/20/2024 0825   WBC 7.9 11/01/2016 0758   RBC 4.60 06/20/2024 0825   RBC 4.87 11/01/2016 0758   HGB 13.7 06/20/2024 0825   HCT 42.2 06/20/2024 0825   PLT 209 06/20/2024 0825   MCV 92 06/20/2024 0825   MCH 29.8 06/20/2024 0825   MCH 29.0 11/01/2016 0758   MCHC 32.5 06/20/2024 0825   MCHC 33.3 11/01/2016 0758   RDW 13.1 06/20/2024 0825   LYMPHSABS 2.1 06/20/2024 0825   MONOABS 0.5 12/18/2010 0102   EOSABS 0.3 06/20/2024 0825   BASOSABS 0.1 06/20/2024 0825    CMP     Component Value Date/Time   NA 139 06/20/2024 0825   K 5.1 06/20/2024 0825   CL 99 06/20/2024 0825   CO2 25 06/20/2024 0825   GLUCOSE 157 (H) 06/20/2024 0825   GLUCOSE 167 (H) 11/01/2016 0758   BUN 19 06/20/2024 0825   CREATININE 1.23 06/20/2024 0825   CREATININE 0.98 03/07/2013 0832   CALCIUM  9.6 06/20/2024 0825   PROT 6.2 06/20/2024 0825   ALBUMIN 4.3 06/20/2024 0825   AST 15 06/20/2024 0825   ALT 17 06/20/2024 0825   ALKPHOS 93 06/20/2024 0825   BILITOT 0.3 06/20/2024 0825   GFRNONAA 88 09/09/2020 1533   GFRAA 102 09/09/2020 1533     Assessment/Plan:  GERD Epigastric pain Nausea and vomiting Globus sensation Patient seen today to discuss EGD.  Reports having a history of GERD for the last 5 years, recently seen in the ED on 08/14/2024 for 1 week of abdominal pain (epigastric),  nausea and vomiting.  No acute findings on CT A/P.  Labs unremarkable.  Was given dicyclomine, Nexium, and Zofran.  States that he is overall feeling better, though continues to have breakthrough heartburn and reflux a couple times a week despite use of Nexium daily.  Also reports frequent globus sensation but denies dysphagia.  No prior EGD.  Reports he has used marijuana for many years.  As he continues to have breakthrough reflux and globus sensation despite PPI, will add EGD to colonoscopy which is currently scheduled for history of colon polyps.  If no room for this on schedule, will need to change date of procedures.  - Add EGD to upcoming colonoscopy. I thoroughly discussed the procedure with the patient to include nature of the procedure, alternatives, benefits, and risks (including but not limited to bleeding, infection, perforation, anesthesia/cardiac/pulmonary complications). Patient verbalized understanding and gave verbal consent to proceed with procedure.  - Increase Nexium to 40 mg twice daily   Camie Furbish, PA-C [facility] 08/28/2024, 1:45 PM  Patient Care Team: Dettinger, Fonda LABOR, MD as PCP - General (Family Medicine)

## 2024-08-28 NOTE — Patient Instructions (Addendum)
 We have sent the following medications to your pharmacy for you to pick up at your convenience: Nexium 40 mg twice a day and suprep  You have been scheduled for an endoscopy and colonoscopy. Please follow the written instructions given to you at your visit today.  If you use inhalers (even only as needed), please bring them with you on the day of your procedure.  DO NOT TAKE 7 DAYS PRIOR TO TEST- Trulicity (dulaglutide) Ozempic, Wegovy (semaglutide) Mounjaro (tirzepatide) Bydureon Bcise (exanatide extended release)  DO NOT TAKE 1 DAY PRIOR TO YOUR TEST Rybelsus (semaglutide) Adlyxin (lixisenatide) Victoza (liraglutide) Byetta (exanatide) ___________________________________________________________________________

## 2024-09-02 ENCOUNTER — Encounter: Payer: Self-pay | Admitting: Internal Medicine

## 2024-09-02 ENCOUNTER — Ambulatory Visit (AMBULATORY_SURGERY_CENTER): Admitting: Internal Medicine

## 2024-09-02 VITALS — BP 94/60 | HR 88 | Temp 97.5°F | Resp 23 | Ht 70.0 in | Wt 199.0 lb

## 2024-09-02 DIAGNOSIS — R1013 Epigastric pain: Secondary | ICD-10-CM

## 2024-09-02 DIAGNOSIS — Z860100 Personal history of colon polyps, unspecified: Secondary | ICD-10-CM

## 2024-09-02 DIAGNOSIS — D120 Benign neoplasm of cecum: Secondary | ICD-10-CM

## 2024-09-02 DIAGNOSIS — K648 Other hemorrhoids: Secondary | ICD-10-CM

## 2024-09-02 DIAGNOSIS — D123 Benign neoplasm of transverse colon: Secondary | ICD-10-CM

## 2024-09-02 DIAGNOSIS — R09A2 Foreign body sensation, throat: Secondary | ICD-10-CM | POA: Diagnosis not present

## 2024-09-02 DIAGNOSIS — Z860101 Personal history of adenomatous and serrated colon polyps: Secondary | ICD-10-CM | POA: Diagnosis not present

## 2024-09-02 DIAGNOSIS — K5730 Diverticulosis of large intestine without perforation or abscess without bleeding: Secondary | ICD-10-CM

## 2024-09-02 DIAGNOSIS — F319 Bipolar disorder, unspecified: Secondary | ICD-10-CM | POA: Diagnosis not present

## 2024-09-02 DIAGNOSIS — Z1211 Encounter for screening for malignant neoplasm of colon: Secondary | ICD-10-CM

## 2024-09-02 DIAGNOSIS — K259 Gastric ulcer, unspecified as acute or chronic, without hemorrhage or perforation: Secondary | ICD-10-CM | POA: Diagnosis not present

## 2024-09-02 DIAGNOSIS — K3189 Other diseases of stomach and duodenum: Secondary | ICD-10-CM | POA: Diagnosis not present

## 2024-09-02 DIAGNOSIS — D125 Benign neoplasm of sigmoid colon: Secondary | ICD-10-CM

## 2024-09-02 DIAGNOSIS — R195 Other fecal abnormalities: Secondary | ICD-10-CM | POA: Diagnosis not present

## 2024-09-02 DIAGNOSIS — K2100 Gastro-esophageal reflux disease with esophagitis, without bleeding: Secondary | ICD-10-CM

## 2024-09-02 DIAGNOSIS — I4891 Unspecified atrial fibrillation: Secondary | ICD-10-CM | POA: Diagnosis not present

## 2024-09-02 MED ORDER — SODIUM CHLORIDE 0.9 % IV SOLN: 500.0000 mL | INTRAVENOUS | Status: DC

## 2024-09-02 NOTE — Patient Instructions (Addendum)
 The upper endoscopy exam showed inflammation of the stomach and possible inflammation in the esophagus.  Biopsies were taken and I will let you know what those results were and any impact on treatment plans.  Colonoscopy revealed 2 definite polyps and 1 possible polyp, these were removed.  You also have internal hemorrhoids and diverticulosis - thickened muscle rings and pouches in the colon wall. Please read the handouts about these conditions.  I will let you know pathology results and when to have another routine colonoscopy by mail and/or My Chart.  I appreciate the opportunity to care for you. Lupita CHARLENA Commander, MD, FACG   TAKE EITHER ESOMEPRAZOLE ( NEXIUM) OR PANTOPRAZOLE  (PROTONIX ) - DO NOT TAKE BOTH  YOU HAD AN ENDOSCOPIC PROCEDURE TODAY AT THE [facility]:   Refer to the procedure report that was given to you for any specific questions about what was found during the examination.  If the procedure report does not answer your questions, please call your gastroenterologist to clarify.  If you requested that your care partner not be given the details of your procedure findings, then the procedure report has been included in a sealed envelope for you to review at your convenience later.  YOU SHOULD EXPECT: Some feelings of bloating in the abdomen. Passage of more gas than usual.  Walking can help get rid of the air that was put into your GI tract during the procedure and reduce the bloating. If you had a lower endoscopy (such as a colonoscopy or flexible sigmoidoscopy) you may notice spotting of blood in your stool or on the toilet paper. If you underwent a bowel prep for your procedure, you may not have a normal bowel movement for a few days.  Please Note:  You might notice some irritation and congestion in your nose or some drainage.  This is from the oxygen used during your procedure.  There is no need for concern and it should clear up in a day or so.  SYMPTOMS TO REPORT  IMMEDIATELY:  Following lower endoscopy (colonoscopy or flexible sigmoidoscopy):  Excessive amounts of blood in the stool  Significant tenderness or worsening of abdominal pains  Swelling of the abdomen that is new, acute  Fever of 100F or higher  Following upper endoscopy (EGD)  Vomiting of blood or coffee ground material  New chest pain or pain under the shoulder blades  Painful or persistently difficult swallowing  New shortness of breath  Fever of 100F or higher  Black, tarry-looking stools  For urgent or emergent issues, a gastroenterologist can be reached at any hour by calling [phone]. Do not use MyChart messaging for urgent concerns.    DIET:  We do recommend a small meal at first, but then you may proceed to your regular diet.  Drink plenty of fluids but you should avoid alcoholic beverages for 24 hours.  ACTIVITY:  You should plan to take it easy for the rest of today and you should NOT DRIVE or use heavy machinery until tomorrow (because of the sedation medicines used during the test).    FOLLOW UP: Our staff will call the number listed on your records the next business day following your procedure.  We will call around 7:15- 8:00 am to check on you and address any questions or concerns that you may have regarding the information given to you following your procedure. If we do not reach you, we will leave a message.     If any biopsies were taken  you will be contacted by phone or by letter within the next 1-3 weeks.  Please call us  at [phone] if you have not heard about the biopsies in 3 weeks.    SIGNATURES/CONFIDENTIALITY: You and/or your care partner have signed paperwork which will be entered into your electronic medical record.  These signatures attest to the fact that that the information above on your After Visit Summary has been reviewed and is understood.  Full responsibility of the confidentiality of this discharge information lies with you and/or  your care-partner.

## 2024-09-02 NOTE — Progress Notes (Signed)
 Sedate, gd SR, tolerated procedure well, VSS, report to RN

## 2024-09-02 NOTE — Progress Notes (Signed)
 Pt's states no medical or surgical changes since previsit or office visit.

## 2024-09-02 NOTE — Progress Notes (Signed)
 History and Physical Interval Note:  09/02/2024 8:05 AM  [name]  has presented today for endoscopic procedure(s), with the diagnosis of  Encounter Diagnoses  Name Primary?   Globus sensation Yes   Abdominal pain, epigastric    Hx of colonic polyps    Positive colorectal cancer screening using Cologuard test   .  The various methods of evaluation and treatment have been discussed with the patient and/or family. After consideration of risks, benefits and other options for treatment, the patient has consented to  the endoscopic procedure(s).   The patient's history has been reviewed, patient examined, no change in status, stable for endoscopic procedure(s).  I have reviewed the patient's chart and labs.  Questions were answered to the patient's satisfaction.     Lupita CHARLENA Commander, MD, NOLIA

## 2024-09-02 NOTE — Op Note (Signed)
 [facility] Patient Name: Sean Price Procedure Date: 09/02/2024 7:51 AM MRN: [id number] Endoscopist: Lupita FORBES Commander , MD, [id number] Age: 68 Referring MD:  Date of Birth: January 18, 1956 Gender: Male Account #: [id number] Procedure:                Upper GI endoscopy Indications:              Epigastric abdominal pain, Globus sensation Medicines:                Monitored Anesthesia Care Procedure:                Pre-Anesthesia Assessment:                           - Prior to the procedure, a History and Physical                            was performed, and patient medications and                            allergies were reviewed. The patient's tolerance of                            previous anesthesia was also reviewed. The risks                            and benefits of the procedure and the sedation                            options and risks were discussed with the patient.                            All questions were answered, and informed consent                            was obtained. Prior Anticoagulants: The patient has                            taken no anticoagulant or antiplatelet agents. ASA                            Grade Assessment: III - A patient with severe                            systemic disease. After reviewing the risks and                            benefits, the patient was deemed in satisfactory                            condition to undergo the procedure.                           After obtaining informed consent, the endoscope was  passed under direct vision. Throughout the                            procedure, the patient's blood pressure, pulse, and                            oxygen saturations were monitored continuously. The                            Olympus Scope [id number] was introduced through the                            mouth, and advanced to the second part of duodenum.                             The upper GI endoscopy was accomplished without                            difficulty. The patient tolerated the procedure                            well. Scope In: Scope Out: Findings:                 Esophagitis with no bleeding was found in the                            middle third of the esophagus. Biopsies were taken                            with a cold forceps for histology. Verification of                            patient identification for the specimen was done.                            Estimated blood loss was minimal.                           Two localized small erosions with no stigmata of                            recent bleeding were found in the cardia. Biopsies                            were taken with a cold forceps for histology.                            Verification of patient identification for the                            specimen was done. Estimated blood loss was minimal.  Diffuse moderately erythematous mucosa without                            bleeding was found in the gastric antrum. Biopsies                            were taken with a cold forceps for histology.                            Verification of patient identification for the                            specimen was done. Estimated blood loss was minimal.                           The gastroesophageal flap valve was visualized                            endoscopically and classified as Hill Grade I                            (prominent fold, tight to endoscope).                           The exam was otherwise without abnormality.                           The cardia and gastric fundus were otherwise normal                            on retroflexion. Complications:            No immediate complications. Estimated Blood Loss:     Estimated blood loss was minimal. Impression:               - Esophagitis with no bleeding. Biopsied.                           -  Erosive gastropathy with no stigmata of recent                            bleeding. Biopsied.                           - Erythematous mucosa in the antrum. Biopsied.                           - Gastroesophageal flap valve classified as Hill                            Grade I (prominent fold, tight to endoscope).                           - The examination was otherwise normal. Recommendation:           - Patient has a contact number available for  emergencies. The signs and symptoms of potential                            delayed complications were discussed with the                            patient. Return to normal activities tomorrow.                            Written discharge instructions were provided to the                            patient.                           - Resume previous diet.                           - Continue present medications. Is on PPI                           - Await pathology results.                           - See the other procedure note for documentation of                            additional recommendations. Lupita FORBES Commander, MD 09/02/2024 8:59:21 AM This report has been signed electronically.

## 2024-09-02 NOTE — Op Note (Signed)
 [facility] Patient Name: Sean Price Procedure Date: 09/02/2024 7:51 AM MRN: [id number] Endoscopist: Lupita FORBES Commander , MD, [id number] Age: 68 Referring MD:  Date of Birth: 1956/07/01 Gender: Male Account #: [id number] Procedure:                Colonoscopy Indications:              Last colonoscopy: 2012, Positive Cologuard test                            also w/ hx adenoma and ssa 2012 Medicines:                Monitored Anesthesia Care Procedure:                Pre-Anesthesia Assessment:                           - Prior to the procedure, a History and Physical                            was performed, and patient medications and                            allergies were reviewed. The patient's tolerance of                            previous anesthesia was also reviewed. The risks                            and benefits of the procedure and the sedation                            options and risks were discussed with the patient.                            All questions were answered, and informed consent                            was obtained. Prior Anticoagulants: The patient has                            taken no anticoagulant or antiplatelet agents. ASA                            Grade Assessment: III - A patient with severe                            systemic disease. After reviewing the risks and                            benefits, the patient was deemed in satisfactory                            condition to undergo the procedure.  After obtaining informed consent, the colonoscope                            was passed under direct vision. Throughout the                            procedure, the patient's blood pressure, pulse, and                            oxygen saturations were monitored continuously. The                            Olympus Scope [id number] was introduced through the                            anus and advanced to  the the cecum, identified by                            appendiceal orifice and ileocecal valve. The                            colonoscopy was performed without difficulty. The                            patient tolerated the procedure well. The quality                            of the bowel preparation was good. The ileocecal                            valve, appendiceal orifice, and rectum were                            photographed. The bowel preparation used was SUPREP                            via split dose instruction. Scope In: 8:20:52 AM Scope Out: 8:43:01 AM Scope Withdrawal Time: 0 hours 17 minutes 38 seconds  Total Procedure Duration: 0 hours 22 minutes 9 seconds  Findings:                 The perianal and digital rectal examinations were                            normal. Pertinent negatives include normal prostate                            (size, shape, and consistency).                           Two sessile polyps were found in the sigmoid colon                            and transverse colon. The polyps were 1 to 7 mm in  size. These polyps were removed with a cold snare.                            Resection and retrieval were complete. Verification                            of patient identification for the specimen was                            done. Estimated blood loss was minimal.                           An 8 to 10 mm polyp was found in the ileocecal                            valve. The polyp was flat. The polyp was removed                            with a cold snare. Resection and retrieval were                            complete. Verification of patient identification                            for the specimen was done. Estimated blood loss was                            minimal.                           Multiple diverticula were found in the sigmoid                            colon.                           Internal  hemorrhoids were found.                           The exam was otherwise without abnormality on                            direct and retroflexion views. Complications:            No immediate complications. Estimated Blood Loss:     Estimated blood loss was minimal. Impression:               - Two 1 to 7 mm polyps in the sigmoid colon and in                            the transverse colon, removed with a cold snare.                            Resected and retrieved.                           -  One 8 to 10 mm polyp at the ileocecal valve,                            removed with a cold snare. Resected and retrieved.                           - Diverticulosis in the sigmoid colon.                           - Internal hemorrhoids.                           - The examination was otherwise normal on direct                            and retroflexion views. Recommendation:           - Patient has a contact number available for                            emergencies. The signs and symptoms of potential                            delayed complications were discussed with the                            patient. Return to normal activities tomorrow.                            Written discharge instructions were provided to the                            patient.                           - Resume previous diet.                           - Continue present medications.                           - Await pathology results.                           - Repeat colonoscopy is recommended for                            surveillance. The colonoscopy date will be                            determined after pathology results from today's                            exam become available for review.                           - See the  other procedure note for documentation of                            additional recommendations. Lupita FORBES Commander, MD 09/02/2024 9:05:30 AM This report has been signed  electronically.

## 2024-09-02 NOTE — Progress Notes (Signed)
 Called to room to assist during endoscopic procedure.  Patient ID and intended procedure confirmed with present staff. Received instructions for my participation in the procedure from the performing physician.

## 2024-09-03 ENCOUNTER — Encounter

## 2024-09-03 ENCOUNTER — Telehealth: Payer: Self-pay

## 2024-09-03 ENCOUNTER — Other Ambulatory Visit (HOSPITAL_COMMUNITY): Payer: Self-pay

## 2024-09-03 NOTE — Telephone Encounter (Signed)
 Pharmacy Patient Advocate Encounter   Received notification from CoverMyMeds that prior authorization for Esomeprazole Magnesium 40MG  dr capsules is required/requested.   Insurance verification completed.   The patient is insured through [employer].   Per test claim: PA required; PA submitted to above mentioned insurance via Latent Key/confirmation #/EOC [facility] Status is pending

## 2024-09-03 NOTE — Telephone Encounter (Signed)
  Follow up Call-     09/02/2024    7:10 AM  Call back number  Post procedure Call Back phone  # [phone]  Permission to leave phone message Yes     Patient questions:  Do you have a fever, pain , or abdominal swelling? No. Pain Score  0 *  Have you tolerated food without any problems? Yes.    Have you been able to return to your normal activities? Yes.    Do you have any questions about your discharge instructions: Diet   No. Medications  No. Follow up visit  No.  Do you have questions or concerns about your Care? No.  Actions: * If pain score is 4 or above: No action needed, pain <4.

## 2024-09-04 ENCOUNTER — Other Ambulatory Visit (HOSPITAL_COMMUNITY): Payer: Self-pay

## 2024-09-04 LAB — SURGICAL PATHOLOGY

## 2024-09-04 NOTE — Telephone Encounter (Signed)
 Pharmacy Patient Advocate Encounter  Received notification from [employer] [facility] [employer] that Prior Authorization for  Esomeprazole Magnesium 40MG  dr capsules has been APPROVED from 11-01-2023 to 10-30-2024. Ran test claim, Copay is $10.62. This test claim was processed through [facility]- copay amounts may vary at other pharmacies due to pharmacy/plan contracts, or as the patient moves through the different stages of their insurance plan.   PA #/Case ID/Reference #: [id number]

## 2024-09-06 ENCOUNTER — Ambulatory Visit: Admitting: Gastroenterology

## 2024-09-09 ENCOUNTER — Ambulatory Visit: Payer: Self-pay | Admitting: Internal Medicine

## 2024-09-16 ENCOUNTER — Ambulatory Visit: Payer: Self-pay | Admitting: Family Medicine

## 2024-09-17 ENCOUNTER — Encounter: Admitting: Internal Medicine

## 2024-09-17 ENCOUNTER — Ambulatory Visit: Admitting: Urology

## 2024-09-20 ENCOUNTER — Encounter: Payer: Self-pay | Admitting: Family Medicine

## 2024-09-20 ENCOUNTER — Ambulatory Visit (INDEPENDENT_AMBULATORY_CARE_PROVIDER_SITE_OTHER): Payer: Self-pay | Admitting: Family Medicine

## 2024-09-20 VITALS — BP 136/86 | Temp 97.6°F | Ht 70.0 in | Wt 200.0 lb

## 2024-09-20 DIAGNOSIS — E119 Type 2 diabetes mellitus without complications: Secondary | ICD-10-CM | POA: Diagnosis not present

## 2024-09-20 DIAGNOSIS — E785 Hyperlipidemia, unspecified: Secondary | ICD-10-CM

## 2024-09-20 DIAGNOSIS — E1159 Type 2 diabetes mellitus with other circulatory complications: Secondary | ICD-10-CM | POA: Diagnosis not present

## 2024-09-20 DIAGNOSIS — F310 Bipolar disorder, current episode hypomanic: Secondary | ICD-10-CM

## 2024-09-20 DIAGNOSIS — I152 Hypertension secondary to endocrine disorders: Secondary | ICD-10-CM | POA: Diagnosis not present

## 2024-09-20 DIAGNOSIS — K219 Gastro-esophageal reflux disease without esophagitis: Secondary | ICD-10-CM | POA: Diagnosis not present

## 2024-09-20 DIAGNOSIS — E1169 Type 2 diabetes mellitus with other specified complication: Secondary | ICD-10-CM | POA: Diagnosis not present

## 2024-09-20 DIAGNOSIS — F320 Major depressive disorder, single episode, mild: Secondary | ICD-10-CM

## 2024-09-20 LAB — BAYER DCA HB A1C WAIVED: HB A1C (BAYER DCA - WAIVED): 7 % — ABNORMAL HIGH (ref 4.8–5.6)

## 2024-09-20 MED ORDER — PANTOPRAZOLE SODIUM 40 MG PO TBEC: 40.0000 mg | DELAYED_RELEASE_TABLET | Freq: Every day | ORAL | 3 refills | Status: AC

## 2024-09-20 MED ORDER — BUPROPION HCL ER (XL) 300 MG PO TB24: 300.0000 mg | ORAL_TABLET | Freq: Every day | ORAL | 3 refills | Status: AC

## 2024-09-20 MED ORDER — ARIPIPRAZOLE 5 MG PO TABS: 5.0000 mg | ORAL_TABLET | Freq: Every day | ORAL | 3 refills | Status: AC

## 2024-09-20 NOTE — Progress Notes (Signed)
 BP 136/86   Temp 97.6 F (36.4 C)   Ht 5' 10 (1.778 m)   Wt 200 lb (90.7 kg)   BMI 28.70 kg/m    Subjective:   Patient ID: Sean Price, male    DOB: December 22, 1955, 68 y.o.   MRN: [id number]  HPI: Sean Price is a 68 y.o. male presenting on 09/20/2024 for Medical Management of Chronic Issues   Discussed the use of AI scribe software for clinical note transcription with the patient, who gave verbal consent to proceed.  History of Present Illness   Sean Price is a 68 year old male who presents for follow-up after a colonoscopy and endoscopy.  Colorectal and upper gastrointestinal findings - Recent colonoscopy and endoscopy revealed a couple of polyps and irritation/inflammation attributed to gastroesophageal reflux disease (GERD) - No significant abnormalities identified on either procedure - Advised to return for follow-up in five years  Glycemic control - Hemoglobin A1c level is 7.0%  Lower urinary tract symptoms - Frequent urination, particularly nocturia, waking every 1-2 hours at night - History of previous urinary difficulties - Scheduled for urologist evaluation - No kidney pain  Knee arthralgia and history of injury - Experiences knee discomfort with extensive walking - History of prior knee injury resulting in prolonged immobility - Inquires about steroid injection for arthritis in anticipation of increased walking during upcoming trip to [place]  in May  Minor skin wound - Sustained a minor wound from a rose bush scratch - Applies antibiotic cream to prevent infection          Relevant past medical, surgical, family and social history reviewed and updated as indicated. Interim medical history since our last visit reviewed. Allergies and medications reviewed and updated.  Review of Systems  Constitutional:  Negative for chills and fever.  Respiratory:  Negative for shortness of breath and wheezing.   Cardiovascular:  Negative  for chest pain and leg swelling.  Musculoskeletal:  Positive for arthralgias. Negative for back pain, gait problem and myalgias.  Skin:  Negative for rash.  All other systems reviewed and are negative.   Per HPI unless specifically indicated above   Allergies as of 09/20/2024   No Known Allergies      Medication List        Accurate as of September 20, 2024 12:37 PM. If you have any questions, ask your nurse or doctor.          ARIPiprazole  5 MG tablet Commonly known as: ABILIFY  Take 1 tablet (5 mg total) by mouth daily.   BLOOD GLUCOSE TEST STRIPS Strp 1 each by In Vitro route 2 (two) times daily.   buPROPion  300 MG 24 hr tablet Commonly known as: WELLBUTRIN  XL Take 1 tablet (300 mg total) by mouth daily. TAKE 1 TABLET BY MOUTH ONCE DAILY   dicyclomine 20 MG tablet Commonly known as: BENTYL Take 20 mg by mouth.   esomeprazole  40 MG capsule Commonly known as: NEXIUM  Take 1 capsule (40 mg total) by mouth 2 (two) times daily before a meal.   glipiZIDE  5 MG tablet Commonly known as: GLUCOTROL  Take 1 tablet (5 mg total) by mouth 2 (two) times daily before a meal.   glucose 5 g chewable tablet Chew 15 g by mouth as needed for low blood sugar.   lisinopril -hydrochlorothiazide  10-12.5 MG tablet Commonly known as: ZESTORETIC  Take 1 tablet by mouth daily.   metFORMIN  500 MG tablet Commonly known as: GLUCOPHAGE  Take 1 tablet (500 mg total)  by mouth 2 (two) times daily with a meal.   ondansetron 4 MG disintegrating tablet Commonly known as: ZOFRAN-ODT Take by mouth.   OneTouch Delica Lancets 33G Misc Test BS BID Dx E11.9   pantoprazole  40 MG tablet Commonly known as: PROTONIX  Take 1 tablet (40 mg total) by mouth daily.   PARoxetine  20 MG tablet Commonly known as: PAXIL  Take 1 tablet (20 mg total) by mouth daily.   rosuvastatin  10 MG tablet Commonly known as: CRESTOR  Take 1 tablet (10 mg total) by mouth daily.   sildenafil  100 MG tablet Commonly known  as: Viagra  Take 1 tablet (100 mg total) by mouth daily as needed for erectile dysfunction.   traZODone 50 MG tablet Commonly known as: DESYREL Take 50 mg by mouth at bedtime as needed.         Objective:   BP 136/86   Temp 97.6 F (36.4 C)   Ht 5' 10 (1.778 m)   Wt 200 lb (90.7 kg)   BMI 28.70 kg/m   Wt Readings from Last 3 Encounters:  09/20/24 200 lb (90.7 kg)  09/02/24 199 lb (90.3 kg)  08/28/24 199 lb 2 oz (90.3 kg)    Physical Exam Physical Exam   NECK: Thyroid normal, no lumps. CHEST: Lungs clear to auscultation bilaterally. CARDIOVASCULAR: Heart regular rate and rhythm, no murmurs.       Results for orders placed or performed in visit on 09/13/24  Cologuard   Collection Time: 08/01/24  2:20 PM  Result Value Ref Range   Cologuard Positive (A) Negative    Assessment & Plan:   Problem List Items Addressed This Visit       Cardiovascular and Mediastinum   Hypertension associated with diabetes (HCC)     Endocrine   Type 2 diabetes mellitus without complications (HCC) - Primary   Relevant Orders   Bayer DCA Hb A1c Waived   Hyperlipidemia associated with type 2 diabetes mellitus (HCC)     Other   Depression, major, single episode, mild   Relevant Medications   ARIPiprazole  (ABILIFY ) 5 MG tablet   buPROPion  (WELLBUTRIN  XL) 300 MG 24 hr tablet   Other Visit Diagnoses       Bipolar affective disorder, current episode hypomanic (HCC)       Relevant Medications   ARIPiprazole  (ABILIFY ) 5 MG tablet     Gastroesophageal reflux disease without esophagitis       Relevant Medications   pantoprazole  (PROTONIX ) 40 MG tablet          Type 2 diabetes mellitus A1c at 7.0 indicates suboptimal control, likely due to increased sedentary behavior. - Encouraged increased physical activity to improve glycemic control.  Gastroesophageal reflux disease Endoscopy showed irritation and inflammation consistent with GERD.  Benign prostatic hyperplasia with lower  urinary tract symptoms Frequent urination and nocturia suggest BPH with possible overflow incontinence. - Referred to urologist for prostate evaluation, including possible prostate exam and ultrasound.  Knee osteoarthritis, left knee Chronic osteoarthritis, left knee. - Administer steroid injection in left knee prior to [place]  trip.  Skin abrasion, lower extremity Recent abrasion from rosebush contact, no infection signs. - Apply antibiotic cream to prevent infection.          Follow up plan: Return in about 3 months (around 12/21/2024), or if symptoms worsen or fail to improve, for Diabetes recheck.  Counseling provided for all of the vaccine components Orders Placed This Encounter  Procedures   Bayer DCA Hb A1c Gerlene Chew  Devaunte Gasparini, MD [facility] 09/20/2024, 12:37 PM

## 2024-10-01 ENCOUNTER — Telehealth: Payer: Self-pay | Admitting: Neurology

## 2024-10-01 NOTE — Telephone Encounter (Signed)
 Pt called to Cancel appt due to not needing appt Appt Cancled

## 2024-10-02 ENCOUNTER — Ambulatory Visit: Admitting: Neurology

## 2024-10-14 NOTE — Progress Notes (Unsigned)
 CC: Elevated PSA  HPI: 68 year old male sent by Dr. Maryanne for evaluation and management of elevated PSA.  Prior PSA data:  February/2018--3.8 August/2025--8.9 (12.1% free)  The patient did have CT abdomen and pelvis in October of this year.  I am not privy to his images, but the reading did state that the patient had prostatomegaly with an enlarged median lobe.   PMH: Past Medical History:  Diagnosis Date   ADD (attention deficit disorder)    Anxiety    Atrial fibrillation with RVR (HCC)    new found on 12/18/10 visit   Bipolar disorder (HCC) 01/2014   BPH with elevated PSA    Depression    Diabetes mellitus without complication (HCC)    GERD (gastroesophageal reflux disease)    Hyperlipidemia    Hypertension    Myocardial infarction [facility])    Sinus arrhythmia     Surgical History: Past Surgical History:  Procedure Laterality Date   bilateral inguinal hernia     CARPAL TUNNEL RELEASE  1999   left hand   CERVICAL FUSION  2007   c5-c6   COLONOSCOPY     compound fracture  1999, 2001, 2001   right leg, insertion of hardware in 2001, removal of hardware 2001   FRACTURE SURGERY     HERNIA REPAIR Bilateral 2012   LEG SURGERY Right    hardware removal   PROSTATE BIOPSY     TRUS/BX  BPH only   TONSILLECTOMY  1970    Home Medications:  Allergies as of 10/15/2024   No Known Allergies      Medication List        Accurate as of October 14, 2024 12:50 PM. If you have any questions, ask your nurse or doctor.          ARIPiprazole  5 MG tablet Commonly known as: ABILIFY  Take 1 tablet (5 mg total) by mouth daily.   BLOOD GLUCOSE TEST STRIPS Strp 1 each by In Vitro route 2 (two) times daily.   buPROPion  300 MG 24 hr tablet Commonly known as: WELLBUTRIN  XL Take 1 tablet (300 mg total) by mouth daily. TAKE 1 TABLET BY MOUTH ONCE DAILY   dicyclomine 20 MG tablet Commonly known as: BENTYL Take 20 mg by mouth.   esomeprazole  40 MG capsule Commonly  known as: NEXIUM  Take 1 capsule (40 mg total) by mouth 2 (two) times daily before a meal.   glipiZIDE  5 MG tablet Commonly known as: GLUCOTROL  Take 1 tablet (5 mg total) by mouth 2 (two) times daily before a meal.   glucose 5 g chewable tablet Chew 15 g by mouth as needed for low blood sugar.   lisinopril -hydrochlorothiazide  10-12.5 MG tablet Commonly known as: ZESTORETIC  Take 1 tablet by mouth daily.   metFORMIN  500 MG tablet Commonly known as: GLUCOPHAGE  Take 1 tablet (500 mg total) by mouth 2 (two) times daily with a meal.   ondansetron 4 MG disintegrating tablet Commonly known as: ZOFRAN-ODT Take by mouth.   OneTouch Delica Lancets 33G Misc Test BS BID Dx E11.9   pantoprazole  40 MG tablet Commonly known as: PROTONIX  Take 1 tablet (40 mg total) by mouth daily.   PARoxetine  20 MG tablet Commonly known as: PAXIL  Take 1 tablet (20 mg total) by mouth daily.   rosuvastatin  10 MG tablet Commonly known as: CRESTOR  Take 1 tablet (10 mg total) by mouth daily.   sildenafil  100 MG tablet Commonly known as: Viagra  Take 1 tablet (100 mg total) by  mouth daily as needed for erectile dysfunction.   traZODone 50 MG tablet Commonly known as: DESYREL Take 50 mg by mouth at bedtime as needed.        Allergies: Allergies[1]  Family History: Family History  Problem Relation Age of Onset   Diabetes Mother    Heart attack Father 39       massive /died   Other Father        blood infection   Breast cancer Sister        mid 46's   Colon cancer Neg Hx    Colon polyps Neg Hx    Esophageal cancer Neg Hx    Rectal cancer Neg Hx    Stomach cancer Neg Hx    Sleep apnea Neg Hx    Restless legs syndrome Neg Hx     Social History:  reports that he quit smoking about 40 years ago. His smoking use included cigarettes. He started smoking about 44 years ago. He has never used smokeless tobacco. He reports that he does not currently use alcohol. He reports that he does not use  drugs.  ROS: All other review of systems were reviewed and are negative except what is noted above in HPI  Physical Exam: There were no vitals taken for this visit.  Constitutional:  Alert and oriented, No acute distress. HEENT: [place] AT, moist mucus membranes.  Trachea midline, no masses. Cardiovascular: No clubbing, cyanosis, or edema. Respiratory: Normal respiratory effort, no increased work of breathing. GI: No inguinal hernias GU: Normal phallus. No masses/lesions on penis, testis, scrotum. Prostate ***g smooth no nodules no induration.  Lymph: No cervical or inguinal lymphadenopathy. Skin: No rashes, bruises or suspicious lesions. Neurologic: Grossly intact, no focal deficits, moving all 4 extremities. Psychiatric: Normal mood and affect.  Laboratory Data: Lab Results  Component Value Date   WBC 6.8 06/20/2024   HGB 13.7 06/20/2024   HCT 42.2 06/20/2024   MCV 92 06/20/2024   PLT 209 06/20/2024    Lab Results  Component Value Date   CREATININE 1.23 06/20/2024    No results found for: PSA  No results found for: TESTOSTERONE  Lab Results  Component Value Date   HGBA1C 7.0 (H) 09/20/2024   PCP notes reviewed  Prior PSA data reviewed  Prior CT scan results reviewed  Assessment:  -Elevated PSA  - BPH with symptoms  Plan:    There are no diagnoses linked to this encounter.  No follow-ups on file.  Garnette CHRISTELLA Shack, MD  [facility] [place]      [1] No Known Allergies

## 2024-10-15 ENCOUNTER — Ambulatory Visit: Admitting: Urology

## 2024-10-15 VITALS — BP 139/76 | HR 66

## 2024-10-15 DIAGNOSIS — N138 Other obstructive and reflux uropathy: Secondary | ICD-10-CM

## 2024-10-15 DIAGNOSIS — N401 Enlarged prostate with lower urinary tract symptoms: Secondary | ICD-10-CM

## 2024-10-15 DIAGNOSIS — N50812 Left testicular pain: Secondary | ICD-10-CM | POA: Diagnosis not present

## 2024-10-15 DIAGNOSIS — R350 Frequency of micturition: Secondary | ICD-10-CM | POA: Diagnosis not present

## 2024-10-15 DIAGNOSIS — R972 Elevated prostate specific antigen [PSA]: Secondary | ICD-10-CM

## 2024-10-15 DIAGNOSIS — Z87448 Personal history of other diseases of urinary system: Secondary | ICD-10-CM

## 2024-10-15 DIAGNOSIS — K4090 Unilateral inguinal hernia, without obstruction or gangrene, not specified as recurrent: Secondary | ICD-10-CM

## 2024-10-15 LAB — URINALYSIS, ROUTINE W REFLEX MICROSCOPIC
Bilirubin, UA: NEGATIVE
Glucose, UA: NEGATIVE
Ketones, UA: NEGATIVE
Leukocytes,UA: NEGATIVE
Nitrite, UA: NEGATIVE
Protein,UA: NEGATIVE
Specific Gravity, UA: 1.02 (ref 1.005–1.030)
Urobilinogen, Ur: 0.2 mg/dL (ref 0.2–1.0)
pH, UA: 6 (ref 5.0–7.5)

## 2024-10-15 LAB — MICROSCOPIC EXAMINATION
Bacteria, UA: NONE SEEN
WBC, UA: NONE SEEN /HPF (ref 0–5)

## 2024-10-15 LAB — BLADDER SCAN AMB NON-IMAGING: Scan Result: 7

## 2024-10-15 MED ORDER — ALFUZOSIN HCL ER 10 MG PO TB24: 10.0000 mg | ORAL_TABLET | Freq: Every day | ORAL | 11 refills | Status: AC

## 2024-10-15 NOTE — Progress Notes (Unsigned)
 Bladder Scan completed today due to reason BPH and patient specified urinary frequency   Patient can void prior to the bladder scan. Bladder scan result: 7  Performed By: Exie DASEN. CMA  Additional notes- Patient is scheduled to follow up with MD

## 2024-10-16 ENCOUNTER — Ambulatory Visit: Payer: Self-pay

## 2024-10-16 ENCOUNTER — Other Ambulatory Visit: Payer: Self-pay | Admitting: Urology

## 2024-10-16 DIAGNOSIS — R972 Elevated prostate specific antigen [PSA]: Secondary | ICD-10-CM

## 2024-10-16 LAB — PSA: Prostate Specific Ag, Serum: 9.9 ng/mL — ABNORMAL HIGH (ref 0.0–4.0)

## 2024-11-12 ENCOUNTER — Other Ambulatory Visit: Payer: Self-pay | Admitting: General Surgery

## 2024-11-12 DIAGNOSIS — R1032 Left lower quadrant pain: Secondary | ICD-10-CM

## 2024-11-14 ENCOUNTER — Inpatient Hospital Stay
Admission: RE | Admit: 2024-11-14 | Discharge: 2024-11-14 | Payer: Medicare (Managed Care) | Attending: General Surgery | Admitting: General Surgery

## 2024-11-14 DIAGNOSIS — R1032 Left lower quadrant pain: Secondary | ICD-10-CM

## 2024-11-27 ENCOUNTER — Encounter: Payer: Self-pay | Admitting: Family Medicine

## 2024-11-27 ENCOUNTER — Other Ambulatory Visit: Payer: Self-pay

## 2024-11-27 DIAGNOSIS — Z8739 Personal history of other diseases of the musculoskeletal system and connective tissue: Secondary | ICD-10-CM

## 2024-12-03 ENCOUNTER — Telehealth: Payer: Self-pay | Admitting: Urology

## 2024-12-03 NOTE — Telephone Encounter (Signed)
 SABRA

## 2024-12-03 NOTE — Telephone Encounter (Signed)
 Follow up on call to Radiology. Radiology state's pt order are at [facility]. Called [facility] and spoke to [place]. Sean Price state's she need pt's last PSA and numbers. PSA given to Sean Price at [facility].

## 2024-12-04 ENCOUNTER — Encounter: Payer: Self-pay | Admitting: Urology

## 2024-12-06 NOTE — Progress Notes (Signed)
 Sean Price                                          MRN: [id number]   12/06/2024   The [facility] Quality Team Specialist reviewed this patient medical record for the purposes of chart review for care gap closure. The following were reviewed: abstraction for care gap closure-kidney health evaluation for diabetes:eGFR  and uACR.    [facility] Quality Team

## 2024-12-13 ENCOUNTER — Other Ambulatory Visit: Payer: Medicare (Managed Care)

## 2024-12-26 ENCOUNTER — Ambulatory Visit: Admitting: Family Medicine

## 2024-12-30 ENCOUNTER — Ambulatory Visit: Payer: Self-pay
